# Patient Record
Sex: Female | Born: 1970 | Race: White | Hispanic: No | Marital: Married | Smoking: Never smoker
Health system: Southern US, Community
[De-identification: ages and names within clinical notes are randomized; demographics above are authoritative.]

## PROBLEM LIST (undated history)

## (undated) DIAGNOSIS — Z8759 Personal history of other complications of pregnancy, childbirth and the puerperium: Secondary | ICD-10-CM

## (undated) DIAGNOSIS — R3915 Urgency of urination: Secondary | ICD-10-CM

## (undated) DIAGNOSIS — R350 Frequency of micturition: Secondary | ICD-10-CM

## (undated) DIAGNOSIS — F988 Other specified behavioral and emotional disorders with onset usually occurring in childhood and adolescence: Secondary | ICD-10-CM

## (undated) DIAGNOSIS — D259 Leiomyoma of uterus, unspecified: Secondary | ICD-10-CM

## (undated) DIAGNOSIS — R319 Hematuria, unspecified: Secondary | ICD-10-CM

## (undated) DIAGNOSIS — N201 Calculus of ureter: Secondary | ICD-10-CM

## (undated) DIAGNOSIS — N200 Calculus of kidney: Secondary | ICD-10-CM

---

## 1998-04-23 ENCOUNTER — Other Ambulatory Visit: Admission: RE | Admit: 1998-04-23 | Discharge: 1998-04-23 | Payer: Self-pay | Admitting: Gynecology

## 1998-05-29 ENCOUNTER — Inpatient Hospital Stay (HOSPITAL_COMMUNITY): Admission: AD | Admit: 1998-05-29 | Discharge: 1998-05-29 | Payer: Self-pay | Admitting: Obstetrics and Gynecology

## 1998-06-01 ENCOUNTER — Ambulatory Visit (HOSPITAL_COMMUNITY): Admission: RE | Admit: 1998-06-01 | Discharge: 1998-06-01 | Payer: Self-pay | Admitting: Obstetrics and Gynecology

## 1998-06-05 ENCOUNTER — Inpatient Hospital Stay (HOSPITAL_COMMUNITY): Admission: AD | Admit: 1998-06-05 | Discharge: 1998-06-05 | Payer: Self-pay | Admitting: Gynecology

## 1998-06-08 ENCOUNTER — Ambulatory Visit (HOSPITAL_COMMUNITY): Admission: RE | Admit: 1998-06-08 | Discharge: 1998-06-08 | Payer: Self-pay | Admitting: Gynecology

## 1998-06-11 ENCOUNTER — Ambulatory Visit (HOSPITAL_COMMUNITY): Admission: RE | Admit: 1998-06-11 | Discharge: 1998-06-11 | Payer: Self-pay | Admitting: Gynecology

## 1998-06-20 ENCOUNTER — Encounter: Payer: Self-pay | Admitting: Gynecology

## 1998-06-20 ENCOUNTER — Observation Stay (HOSPITAL_COMMUNITY): Admission: AD | Admit: 1998-06-20 | Discharge: 1998-06-21 | Payer: Self-pay | Admitting: Gynecology

## 1998-06-24 ENCOUNTER — Ambulatory Visit (HOSPITAL_COMMUNITY): Admission: RE | Admit: 1998-06-24 | Discharge: 1998-06-24 | Payer: Self-pay | Admitting: Gynecology

## 1998-07-01 ENCOUNTER — Ambulatory Visit (HOSPITAL_COMMUNITY): Admission: RE | Admit: 1998-07-01 | Discharge: 1998-07-01 | Payer: Self-pay | Admitting: Gynecology

## 1998-08-24 ENCOUNTER — Encounter: Payer: Self-pay | Admitting: Gynecology

## 1998-08-24 ENCOUNTER — Inpatient Hospital Stay (HOSPITAL_COMMUNITY): Admission: RE | Admit: 1998-08-24 | Discharge: 1998-08-27 | Payer: Self-pay | Admitting: Gynecology

## 1998-11-25 ENCOUNTER — Encounter: Payer: Self-pay | Admitting: Gynecology

## 1998-11-25 ENCOUNTER — Ambulatory Visit (HOSPITAL_COMMUNITY): Admission: RE | Admit: 1998-11-25 | Discharge: 1998-11-25 | Payer: Self-pay | Admitting: Gynecology

## 1999-01-01 ENCOUNTER — Encounter: Payer: Self-pay | Admitting: Gynecology

## 1999-01-01 ENCOUNTER — Inpatient Hospital Stay (HOSPITAL_COMMUNITY): Admission: AD | Admit: 1999-01-01 | Discharge: 1999-01-04 | Payer: Self-pay | Admitting: Gynecology

## 1999-08-06 ENCOUNTER — Other Ambulatory Visit: Admission: RE | Admit: 1999-08-06 | Discharge: 1999-08-06 | Payer: Self-pay | Admitting: Obstetrics and Gynecology

## 1999-08-23 ENCOUNTER — Ambulatory Visit (HOSPITAL_COMMUNITY): Admission: RE | Admit: 1999-08-23 | Discharge: 1999-08-23 | Payer: Self-pay | Admitting: Obstetrics and Gynecology

## 2000-01-20 ENCOUNTER — Inpatient Hospital Stay (HOSPITAL_COMMUNITY): Admission: AD | Admit: 2000-01-20 | Discharge: 2000-01-20 | Payer: Self-pay | Admitting: Obstetrics and Gynecology

## 2000-05-11 ENCOUNTER — Inpatient Hospital Stay (HOSPITAL_COMMUNITY): Admission: AD | Admit: 2000-05-11 | Discharge: 2000-05-14 | Payer: Self-pay | Admitting: Obstetrics and Gynecology

## 2000-06-21 ENCOUNTER — Other Ambulatory Visit: Admission: RE | Admit: 2000-06-21 | Discharge: 2000-06-21 | Payer: Self-pay | Admitting: Obstetrics and Gynecology

## 2000-10-03 HISTORY — PX: MYOMECTOMY ABDOMINAL APPROACH: SUR870

## 2000-10-03 HISTORY — PX: ECTOPIC PREGNANCY SURGERY: SHX613

## 2001-06-29 ENCOUNTER — Inpatient Hospital Stay (HOSPITAL_COMMUNITY): Admission: AD | Admit: 2001-06-29 | Discharge: 2001-06-29 | Payer: Self-pay | Admitting: Obstetrics and Gynecology

## 2001-06-29 ENCOUNTER — Encounter: Payer: Self-pay | Admitting: Obstetrics and Gynecology

## 2001-07-17 ENCOUNTER — Inpatient Hospital Stay (HOSPITAL_COMMUNITY): Admission: AD | Admit: 2001-07-17 | Discharge: 2001-07-17 | Payer: Self-pay | Admitting: Obstetrics and Gynecology

## 2001-07-18 ENCOUNTER — Encounter: Payer: Self-pay | Admitting: Obstetrics and Gynecology

## 2001-08-10 ENCOUNTER — Encounter: Payer: Self-pay | Admitting: Obstetrics and Gynecology

## 2001-08-10 ENCOUNTER — Ambulatory Visit (HOSPITAL_COMMUNITY): Admission: RE | Admit: 2001-08-10 | Discharge: 2001-08-10 | Payer: Self-pay | Admitting: Obstetrics and Gynecology

## 2001-12-19 ENCOUNTER — Other Ambulatory Visit: Admission: RE | Admit: 2001-12-19 | Discharge: 2001-12-19 | Payer: Self-pay | Admitting: Obstetrics and Gynecology

## 2002-06-26 ENCOUNTER — Inpatient Hospital Stay (HOSPITAL_COMMUNITY): Admission: AD | Admit: 2002-06-26 | Discharge: 2002-06-29 | Payer: Self-pay | Admitting: Obstetrics and Gynecology

## 2003-03-25 ENCOUNTER — Encounter: Payer: Self-pay | Admitting: Urology

## 2003-03-25 ENCOUNTER — Ambulatory Visit (HOSPITAL_COMMUNITY): Admission: RE | Admit: 2003-03-25 | Discharge: 2003-03-25 | Payer: Self-pay | Admitting: Urology

## 2006-02-01 ENCOUNTER — Encounter: Payer: Self-pay | Admitting: Obstetrics and Gynecology

## 2011-12-16 ENCOUNTER — Other Ambulatory Visit (HOSPITAL_COMMUNITY): Payer: Self-pay | Admitting: Internal Medicine

## 2011-12-16 DIAGNOSIS — Z139 Encounter for screening, unspecified: Secondary | ICD-10-CM

## 2011-12-19 ENCOUNTER — Ambulatory Visit (HOSPITAL_COMMUNITY)
Admission: RE | Admit: 2011-12-19 | Discharge: 2011-12-19 | Disposition: A | Discharge status: Home / Self Care | Payer: BC Managed Care – PPO | Source: Ambulatory Visit | Attending: Internal Medicine | Admitting: Internal Medicine

## 2011-12-19 DIAGNOSIS — Z139 Encounter for screening, unspecified: Secondary | ICD-10-CM

## 2011-12-19 DIAGNOSIS — Z1231 Encounter for screening mammogram for malignant neoplasm of breast: Secondary | ICD-10-CM | POA: Insufficient documentation

## 2011-12-19 DIAGNOSIS — R928 Other abnormal and inconclusive findings on diagnostic imaging of breast: Secondary | ICD-10-CM | POA: Insufficient documentation

## 2011-12-26 ENCOUNTER — Other Ambulatory Visit: Payer: Self-pay | Admitting: Internal Medicine

## 2011-12-26 DIAGNOSIS — R928 Other abnormal and inconclusive findings on diagnostic imaging of breast: Secondary | ICD-10-CM

## 2011-12-28 ENCOUNTER — Other Ambulatory Visit (HOSPITAL_COMMUNITY): Payer: Self-pay | Admitting: Internal Medicine

## 2011-12-28 ENCOUNTER — Other Ambulatory Visit: Payer: Self-pay | Admitting: Internal Medicine

## 2011-12-28 ENCOUNTER — Ambulatory Visit (HOSPITAL_COMMUNITY)
Admission: RE | Admit: 2011-12-28 | Discharge: 2011-12-28 | Disposition: A | Discharge status: Home / Self Care | Payer: BC Managed Care – PPO | Source: Ambulatory Visit | Attending: Internal Medicine | Admitting: Internal Medicine

## 2011-12-28 DIAGNOSIS — R928 Other abnormal and inconclusive findings on diagnostic imaging of breast: Secondary | ICD-10-CM

## 2011-12-28 DIAGNOSIS — N6009 Solitary cyst of unspecified breast: Secondary | ICD-10-CM | POA: Insufficient documentation

## 2011-12-28 NOTE — Discharge Instructions (Signed)
Breast Biopsy WHY YOU NEED A BIOPSY Your caregiver has recommended that you have a breast tissue sample taken (biopsy). This is done to be certain that the lump or abnormality found in your breast is not cancerous (malignant). During a biopsy, a small piece of tissue is removed, so it can be examined under a microscope by a specialist (pathologist) who looks at tissues and cells and diagnoses abnormalities in them. Most lumps (tumors) or abnormalities, on or in the breast, are not cancerous (benign). However, biopsies are taken when your caregiver cannot be absolutely certain of what is wrong only from doing a physical exam, mammogram (breast X-ray), or other studies. A breast biopsy can tell you whether nothing more needs to be done, or you need more surgery or another type of treatment. A biopsy is done when there is:  Any undiagnosed breast mass.   Nipple abnormalities, dimpling, crusting, or ulcerations.   Calcium deposits (calcifications) or abnormalities seen on your mammogram, ultrasound, or MRI.   Suspicious changes in the breast (thickening, asymmetry) seen on mammogram.   Abnormal discharge from the nipple, especially blood.   Redness, swelling, and pain of the breast.  HOW A BIOPSY IS PERFORMED A biopsy is often performed on an outpatient basis (you go home the same day). This can be done in a hospital, clinic, or surgical center. Tissue samples (biopsies) are often done under local anesthesia (area is numbed). Sometimes general anesthetics are required, in which case you sleep through the procedure. Biopsies may remove the entire lump, a small piece of the lump, or a small sliver of tissue removed by needle. TYPES OF BREAST BIOPSY  Fine needle aspiration. A thin needle is placed through the skin, to the lump or cyst, and cells are removed.   Core needle biopsy. A large needle with a special tip is placed through the skin, to the abnormality, and a piece of tissue is removed.    Stereotactic biopsy. A core needle with a special X-ray is used, to direct the needle to the lump or abnormal area, which is difficult to feel or cannot be felt.   Vacuum-assisted biopsy. A hollow probe and a gentle vacuum remove a sample of tissue.   Ultrasound guided core needle biopsy. You lie on your stomach, with your breast through an opening, and a high frequency ultrasound helps guide the needle to the area of the abnormality.   Open biopsy. An incision is made in the breast, and a piece of the lump or the whole lump is removed.  LET YOUR CAREGIVER KNOW ABOUT:  Allergies.   Medicines taken, including herbs, eye drops, over-the-counter medicines, and creams.   Use of steroids (by mouth or creams).   Previous problems with anesthetics or Novocaine.   If you are taking aspirin or blood thinners.   Possibility of pregnancy, if this applies.   History of blood clots (thrombophlebitis).   History of bleeding or blood problems.   Previous surgery.   Other health problems.  RISKS AND COMPLICATIONS   Bleeding.   Infection.   Allergy to medicines.   Bruising and swelling of the breast.   Alteration in the shape of the breast.   Not finding the lump or abnormality.   Needing more surgery.  BEFORE THE PROCEDURE  You should arrive 60 minutes prior to your procedure or as directed.   Check-in at the admissions desk, to fill out necessary forms, if you are not preregistered.   There will be consent forms   to sign, prior to the procedure.   There is a waiting area for your family, while you are having your biopsy.   Try to have someone with you, to drive you home.   Do not smoke for 2 weeks before the surgery.   Let your caregiver know if you develop a cold or an infection.   Do not drink alcohol for at least 24 hours before surgery.   Wear a good support bra to the surgery.  AFTER THE PROCEDURE  After surgery, you will be taken to the recovery area, where a  nurse will watch and check your progress. Once you are awake, stable, and taking fluids well, if there are no other problems, you will be allowed to go home.   Ice packs applied to your operative site may help with discomfort and keep the swelling down.   You may resume normal diet and activities as directed. Avoid strenuous activities affecting the arm on the side of the biopsy, such as tennis, swimming, heavy lifting (more than 10 pounds) or pulling.   Bruising in the breast is normal following this procedure.   Wearing a support bra, even to bed, may be more comfortable. The bra will also help keep the dressing on.   Change dressings as directed.   Your doctor may apply a pressure dressing on your breast for 24 to 48 hours.   Only take over-the-counter or prescription medicines for pain, discomfort, or fever as directed by your caregiver.   Do not take aspirin, because it can cause bleeding.  HOME CARE INSTRUCTIONS   You may resume your usual diet.   Have someone drive you home after the surgery.   Do not do any exercise, driving, lifting or general activities without your caregiver's permission.   Take medicines and over-the-counter medicines, as ordered by your caregiver.   Keep your postoperative appointments as recommended.   Do not drink alcohol while taking pain medicine.  Finding out the results of your test Not all test results are available during your visit. If your test results are not back during the visit, make an appointment with your caregiver to find out the results. Do not assume everything is normal if you have not heard from your caregiver or the medical facility. It is important for you to follow up on all of your test results.  SEEK MEDICAL CARE IF:   You notice redness, swelling, or increasing pain in the wound.   You notice a bad smell coming from the wound or dressing.   You develop a rash.   You need stronger pain medicine.   You are having an  allergic reaction or problems with your medicines.  SEEK IMMEDIATE MEDICAL CARE IF:   You have difficulty breathing.   You have a fever.   There is increased bleeding (more than a small spot) from the wound.   Pus is coming from the wound.   The wound is breaking open.  Document Released: 09/19/2005 Document Revised: 09/08/2011 Document Reviewed: 08/07/2009 [facility] Patient Information 2012 [place], [place].Breast Biopsy  Care After Please read the instructions outlined below and refer to this sheet in the next few weeks. These discharge instructions provide you with general information on caring for yourself after you leave the hospital. Your caregiver may also give you specific instructions. While your treatment has been planned according to the most current medical practices available, unavoidable complications occasionally occur. If you have any problems or questions after discharge, please call your  caregiver. HOME CARE INSTRUCTIONS   Only take over-the-counter or prescription medicines for pain, discomfort, or fever as directed by your caregiver.   Do not take any product containing aspirin. It can cause bleeding.   Keep the stitches dry when bathing.   Avoid strenuous activities (stretching, reaching, jogging, lifting over 3 pounds) until your caregiver tells you it is okay.   Resume your usual diet.   Wear a good support bra, for as long as you are instructed.   If you are given an ice pack, apply it as many days as directed.   Change the dressing as advised.   Do not drink alcohol while taking pain medicine.   A small amount of bruising is normal after a biopsy.   Keep all your postoperative appointments.  Finding out the results of your test Ask when your test results will be ready. Make sure you get your test results. SEEK MEDICAL CARE IF:   You notice redness, swelling, or increasing pain in the wound.   You notice a bad smell coming from the wound or dressing.    The wound breaks open after the stitches (sutures), staples, or skin adhesive strips have been removed.   You develop a rash.   You have side effects or an allergic reaction to the medicine.   You need stronger medicine.  SEEK IMMEDIATE MEDICAL CARE IF:   You have a fever.   There is increased bleeding (more than a small spot) from the wound.   You have difficulty breathing.   Pus is coming from the wound.  Document Released: 04/08/2005 Document Revised: 09/08/2011 Document Reviewed: 08/07/2009 [facility] Patient Information 2012 [place], [place].

## 2011-12-28 NOTE — Progress Notes (Signed)
Lidocaine 2%       3mL injected 

## 2012-12-05 ENCOUNTER — Other Ambulatory Visit (HOSPITAL_COMMUNITY): Payer: Self-pay | Admitting: Family Medicine

## 2012-12-05 DIAGNOSIS — Z139 Encounter for screening, unspecified: Secondary | ICD-10-CM

## 2012-12-31 ENCOUNTER — Ambulatory Visit (HOSPITAL_COMMUNITY)
Admission: RE | Admit: 2012-12-31 | Discharge: 2012-12-31 | Disposition: A | Discharge status: Home / Self Care | Payer: BC Managed Care – PPO | Source: Ambulatory Visit | Attending: Family Medicine | Admitting: Family Medicine

## 2012-12-31 DIAGNOSIS — Z139 Encounter for screening, unspecified: Secondary | ICD-10-CM

## 2012-12-31 DIAGNOSIS — Z1231 Encounter for screening mammogram for malignant neoplasm of breast: Secondary | ICD-10-CM | POA: Insufficient documentation

## 2014-01-01 ENCOUNTER — Other Ambulatory Visit: Payer: Self-pay

## 2014-01-01 DIAGNOSIS — Z1231 Encounter for screening mammogram for malignant neoplasm of breast: Secondary | ICD-10-CM

## 2014-01-02 ENCOUNTER — Ambulatory Visit
Admission: RE | Admit: 2014-01-02 | Discharge: 2014-01-02 | Disposition: A | Discharge status: Home / Self Care | Payer: BC Managed Care – PPO | Source: Ambulatory Visit

## 2014-01-02 DIAGNOSIS — Z1231 Encounter for screening mammogram for malignant neoplasm of breast: Secondary | ICD-10-CM

## 2014-07-24 ENCOUNTER — Emergency Department (HOSPITAL_COMMUNITY): Payer: BC Managed Care – PPO

## 2014-07-24 ENCOUNTER — Encounter (HOSPITAL_COMMUNITY): Payer: Self-pay | Admitting: Emergency Medicine

## 2014-07-24 ENCOUNTER — Emergency Department (HOSPITAL_COMMUNITY)
Admission: EM | Admit: 2014-07-24 | Discharge: 2014-07-24 | Disposition: A | Discharge status: Home / Self Care | Payer: BC Managed Care – PPO | Attending: Emergency Medicine | Admitting: Emergency Medicine

## 2014-07-24 DIAGNOSIS — Z8659 Personal history of other mental and behavioral disorders: Secondary | ICD-10-CM | POA: Insufficient documentation

## 2014-07-24 DIAGNOSIS — R531 Weakness: Secondary | ICD-10-CM | POA: Diagnosis not present

## 2014-07-24 DIAGNOSIS — Y9389 Activity, other specified: Secondary | ICD-10-CM | POA: Diagnosis not present

## 2014-07-24 DIAGNOSIS — S161XXA Strain of muscle, fascia and tendon at neck level, initial encounter: Secondary | ICD-10-CM | POA: Diagnosis not present

## 2014-07-24 DIAGNOSIS — Y92410 Unspecified street and highway as the place of occurrence of the external cause: Secondary | ICD-10-CM | POA: Insufficient documentation

## 2014-07-24 DIAGNOSIS — S199XXA Unspecified injury of neck, initial encounter: Secondary | ICD-10-CM | POA: Diagnosis present

## 2014-07-24 HISTORY — DX: Other specified behavioral and emotional disorders with onset usually occurring in childhood and adolescence: F98.8

## 2014-07-24 MED ORDER — ONDANSETRON 4 MG PO TBDP
4.0000 mg | ORAL_TABLET | Freq: Once | ORAL | Status: AC
  Administered 2014-07-24: 4 mg via ORAL
  Filled 2014-07-24: qty 1

## 2014-07-24 MED ORDER — ONDANSETRON 4 MG PO TBDP: ORAL_TABLET | ORAL | Status: DC

## 2014-07-24 MED ORDER — NAPROXEN 500 MG PO TABS: 500.0000 mg | ORAL_TABLET | Freq: Two times a day (BID) | ORAL | Status: DC
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
## 2014-07-24 NOTE — Discharge Instructions (Signed)
Follow up with your family md next week. °
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
## 2014-07-24 NOTE — ED Notes (Signed)
Discharge instructions and prescriptions given and reviewed with patient.  Patient verbalized understanding to follow up with PMD next week and to take medication as directed.  Patient ambulatory with steady gait; discharged home in good condition.

## 2014-07-31 ENCOUNTER — Other Ambulatory Visit (HOSPITAL_COMMUNITY): Payer: Self-pay | Admitting: Family Medicine

## 2014-07-31 DIAGNOSIS — R51 Headache: Principal | ICD-10-CM

## 2014-07-31 DIAGNOSIS — R519 Headache, unspecified: Secondary | ICD-10-CM

## 2014-07-31 DIAGNOSIS — R410 Disorientation, unspecified: Secondary | ICD-10-CM

## 2014-08-01 ENCOUNTER — Ambulatory Visit (HOSPITAL_COMMUNITY)
Admission: RE | Admit: 2014-08-01 | Discharge: 2014-08-01 | Disposition: A | Discharge status: Home / Self Care | Payer: BC Managed Care – PPO | Source: Ambulatory Visit | Attending: Family Medicine | Admitting: Family Medicine

## 2014-08-01 DIAGNOSIS — R410 Disorientation, unspecified: Secondary | ICD-10-CM

## 2014-08-01 DIAGNOSIS — R519 Headache, unspecified: Secondary | ICD-10-CM

## 2014-08-01 DIAGNOSIS — R51 Headache: Secondary | ICD-10-CM | POA: Insufficient documentation

## 2014-08-09 ENCOUNTER — Encounter (HOSPITAL_COMMUNITY): Payer: Self-pay | Admitting: Emergency Medicine

## 2014-08-09 ENCOUNTER — Emergency Department (HOSPITAL_COMMUNITY): Payer: BC Managed Care – PPO

## 2014-08-09 ENCOUNTER — Emergency Department (HOSPITAL_COMMUNITY)
Admission: EM | Admit: 2014-08-09 | Discharge: 2014-08-09 | Disposition: A | Discharge status: Home / Self Care | Payer: BC Managed Care – PPO | Attending: Emergency Medicine | Admitting: Emergency Medicine

## 2014-08-09 DIAGNOSIS — R200 Anesthesia of skin: Secondary | ICD-10-CM | POA: Insufficient documentation

## 2014-08-09 DIAGNOSIS — Z79899 Other long term (current) drug therapy: Secondary | ICD-10-CM | POA: Insufficient documentation

## 2014-08-09 DIAGNOSIS — F909 Attention-deficit hyperactivity disorder, unspecified type: Secondary | ICD-10-CM | POA: Insufficient documentation

## 2014-08-09 DIAGNOSIS — Z3202 Encounter for pregnancy test, result negative: Secondary | ICD-10-CM | POA: Diagnosis not present

## 2014-08-09 DIAGNOSIS — R4701 Aphasia: Secondary | ICD-10-CM | POA: Diagnosis present

## 2014-08-09 DIAGNOSIS — R479 Unspecified speech disturbances: Secondary | ICD-10-CM

## 2014-08-09 DIAGNOSIS — F8081 Childhood onset fluency disorder: Secondary | ICD-10-CM

## 2014-08-09 LAB — CBC WITH DIFFERENTIAL/PLATELET
Basophils Absolute: 0.1 10*3/uL (ref 0.0–0.1)
Basophils Relative: 1 % (ref 0–1)
Eosinophils Absolute: 0.1 10*3/uL (ref 0.0–0.7)
Eosinophils Relative: 1 % (ref 0–5)
HCT: 42.5 % (ref 36.0–46.0)
HEMOGLOBIN: 13.8 g/dL (ref 12.0–15.0)
LYMPHS ABS: 1.3 10*3/uL (ref 0.7–4.0)
Lymphocytes Relative: 17 % (ref 12–46)
MCH: 29.6 pg (ref 26.0–34.0)
MCHC: 32.5 g/dL (ref 30.0–36.0)
MCV: 91.2 fL (ref 78.0–100.0)
MONOS PCT: 8 % (ref 3–12)
Monocytes Absolute: 0.6 10*3/uL (ref 0.1–1.0)
NEUTROS ABS: 5.5 10*3/uL (ref 1.7–7.7)
NEUTROS PCT: 73 % (ref 43–77)
Platelets: 295 10*3/uL (ref 150–400)
RBC: 4.66 MIL/uL (ref 3.87–5.11)
RDW: 13 % (ref 11.5–15.5)
WBC: 7.4 10*3/uL (ref 4.0–10.5)

## 2014-08-09 LAB — BASIC METABOLIC PANEL
Anion gap: 14 (ref 5–15)
BUN: 10 mg/dL (ref 6–23)
CHLORIDE: 102 meq/L (ref 96–112)
CO2: 25 meq/L (ref 19–32)
Calcium: 9.5 mg/dL (ref 8.4–10.5)
Creatinine, Ser: 0.48 mg/dL — ABNORMAL LOW (ref 0.50–1.10)
GFR calc Af Amer: >90 mL/min (ref >90)
GFR calc non Af Amer: >90 mL/min (ref >90)
GLUCOSE: 113 mg/dL — AB (ref 70–99)
POTASSIUM: 3.4 meq/L — AB (ref 3.7–5.3)
Sodium: 141 mEq/L (ref 137–147)

## 2014-08-09 LAB — URINALYSIS, ROUTINE W REFLEX MICROSCOPIC
Bilirubin Urine: NEGATIVE
GLUCOSE, UA: 250 mg/dL — AB
Hgb urine dipstick: NEGATIVE
Ketones, ur: NEGATIVE mg/dL
LEUKOCYTES UA: NEGATIVE
Nitrite: NEGATIVE
PH: 7 (ref 5.0–8.0)
PROTEIN: NEGATIVE mg/dL
Specific Gravity, Urine: 1.018 (ref 1.005–1.030)
Urobilinogen, UA: 0.2 mg/dL (ref 0.0–1.0)

## 2014-08-09 LAB — POC URINE PREG, ED: Preg Test, Ur: NEGATIVE

## 2014-08-09 MED ORDER — SODIUM CHLORIDE 0.9 % IV SOLN
INTRAVENOUS | Status: DC
  Administered 2014-08-09: 12:00:00 via INTRAVENOUS

## 2014-08-09 MED ORDER — GADOBENATE DIMEGLUMINE 529 MG/ML IV SOLN
10.0000 mL | Freq: Once | INTRAVENOUS | Status: AC | PRN
  Administered 2014-08-09: 10 mL via INTRAVENOUS

## 2014-08-09 MED ORDER — LORAZEPAM 2 MG/ML IJ SOLN
1.0000 mg | Freq: Once | INTRAMUSCULAR | Status: AC
  Administered 2014-08-09: 1 mg via INTRAVENOUS
  Filled 2014-08-09: qty 1

## 2014-08-09 NOTE — Discharge Instructions (Signed)
You had a normal MRI and MRA of your brain, MRA of your neck.  Her neurologist has seen you and does not feel that your symptoms are due to a stroke or postconcussive symptoms. Your labs, urine were normal today. We feel you're safe to go home. Our neurologist feels that your symptoms should begin to improve over time. Please follow-up with your PCP for further evaluation.    Concussion A concussion, or closed-head injury, is a brain injury caused by a direct blow to the head or by a quick and sudden movement (jolt) of the head or neck. Concussions are usually not life-threatening. Even so, the effects of a concussion can be serious. If you have had a concussion before, you are more likely to experience concussion-like symptoms after a direct blow to the head.  CAUSES  Direct blow to the head, such as from running into another player during a soccer game, being hit in a fight, or hitting your head on a hard surface.  A jolt of the head or neck that causes the brain to move back and forth inside the skull, such as in a car crash. SIGNS AND SYMPTOMS The signs of a concussion can be hard to notice. Early on, they may be missed by you, family members, and health care providers. You may look fine but act or feel differently. Symptoms are usually temporary, but they may last for days, weeks, or even longer. Some symptoms may appear right away while others may not show up for hours or days. Every head injury is different. Symptoms include:  Mild to moderate headaches that will not go away.  A feeling of pressure inside your head.  Having more trouble than usual:  Learning or remembering things you have heard.  Answering questions.  Paying attention or concentrating.  Organizing daily tasks.  Making decisions and solving problems.  Slowness in thinking, acting or reacting, speaking, or reading.  Getting lost or being easily confused.  Feeling tired all the time or lacking energy  (fatigued).  Feeling drowsy.  Sleep disturbances.  Sleeping more than usual.  Sleeping less than usual.  Trouble falling asleep.  Trouble sleeping (insomnia).  Loss of balance or feeling lightheaded or dizzy.  Nausea or vomiting.  Numbness or tingling.  Increased sensitivity to:  Sounds.  Lights.  Distractions.  Vision problems or eyes that tire easily.  Diminished sense of taste or smell.  Ringing in the ears.  Mood changes such as feeling sad or anxious.  Becoming easily irritated or angry for little or no reason.  Lack of motivation.  Seeing or hearing things other people do not see or hear (hallucinations). DIAGNOSIS Your health care provider can usually diagnose a concussion based on a description of your injury and symptoms. He or she will ask whether you passed out (lost consciousness) and whether you are having trouble remembering events that happened right before and during your injury. Your evaluation might include:  A brain scan to look for signs of injury to the brain. Even if the test shows no injury, you may still have a concussion.  Blood tests to be sure other problems are not present. TREATMENT  Concussions are usually treated in an emergency department, in urgent care, or at a clinic. You may need to stay in the hospital overnight for further treatment.  Tell your health care provider if you are taking any medicines, including prescription medicines, over-the-counter medicines, and natural remedies. Some medicines, such as blood thinners (anticoagulants) and  aspirin, may increase the chance of complications. Also tell your health care provider whether you have had alcohol or are taking illegal drugs. This information may affect treatment.  Your health care provider will send you home with important instructions to follow.  How fast you will recover from a concussion depends on many factors. These factors include how severe your concussion is,  what part of your brain was injured, your age, and how healthy you were before the concussion.  Most people with mild injuries recover fully. Recovery can take time. In general, recovery is slower in older persons. Also, persons who have had a concussion in the past or have other medical problems may find that it takes longer to recover from their current injury. HOME CARE INSTRUCTIONS General Instructions  Carefully follow the directions your health care provider gave you.  Only take over-the-counter or prescription medicines for pain, discomfort, or fever as directed by your health care provider.  Take only those medicines that your health care provider has approved.  Do not drink alcohol until your health care provider says you are well enough to do so. Alcohol and certain other drugs may slow your recovery and can put you at risk of further injury.  If it is harder than usual to remember things, write them down.  If you are easily distracted, try to do one thing at a time. For example, do not try to watch TV while fixing dinner.  Talk with family members or close friends when making important decisions.  Keep all follow-up appointments. Repeated evaluation of your symptoms is recommended for your recovery.  Watch your symptoms and tell others to do the same. Complications sometimes occur after a concussion. Older adults with a brain injury may have a higher risk of serious complications, such as a blood clot on the brain.  Tell your teachers, school nurse, school counselor, coach, athletic trainer, or work Freight forwarder about your injury, symptoms, and restrictions. Tell them about what you can or cannot do. They should watch for:  Increased problems with attention or concentration.  Increased difficulty remembering or learning new information.  Increased time needed to complete tasks or assignments.  Increased irritability or decreased ability to cope with stress.  Increased  symptoms.  Rest. Rest helps the brain to heal. Make sure you:  Get plenty of sleep at night. Avoid staying up late at night.  Keep the same bedtime hours on weekends and weekdays.  Rest during the day. Take daytime naps or rest breaks when you feel tired.  Limit activities that require a lot of thought or concentration. These include:  Doing homework or job-related work.  Watching TV.  Working on the computer.  Avoid any situation where there is potential for another head injury (football, hockey, soccer, basketball, martial arts, downhill snow sports and horseback riding). Your condition will get worse every time you experience a concussion. You should avoid these activities until you are evaluated by the appropriate follow-up health care providers. Returning To Your Regular Activities You will need to return to your normal activities slowly, not all at once. You must give your body and brain enough time for recovery.  Do not return to sports or other athletic activities until your health care provider tells you it is safe to do so.  Ask your health care provider when you can drive, ride a bicycle, or operate heavy machinery. Your ability to react may be slower after a brain injury. Never do these activities if you  are dizzy.  Ask your health care provider about when you can return to work or school. Preventing Another Concussion It is very important to avoid another brain injury, especially before you have recovered. In rare cases, another injury can lead to permanent brain damage, brain swelling, or death. The risk of this is greatest during the first 7-10 days after a head injury. Avoid injuries by:  Wearing a seat belt when riding in a car.  Drinking alcohol only in moderation.  Wearing a helmet when biking, skiing, skateboarding, skating, or doing similar activities.  Avoiding activities that could lead to a second concussion, such as contact or recreational sports, until  your health care provider says it is okay.  Taking safety measures in your home.  Remove clutter and tripping hazards from floors and stairways.  Use grab bars in bathrooms and handrails by stairs.  Place non-slip mats on floors and in bathtubs.  Improve lighting in dim areas. SEEK MEDICAL CARE IF:  You have increased problems paying attention or concentrating.  You have increased difficulty remembering or learning new information.  You need more time to complete tasks or assignments than before.  You have increased irritability or decreased ability to cope with stress.  You have more symptoms than before. Seek medical care if you have any of the following symptoms for more than 2 weeks after your injury:  Lasting (chronic) headaches.  Dizziness or balance problems.  Nausea.  Vision problems.  Increased sensitivity to noise or light.  Depression or mood swings.  Anxiety or irritability.  Memory problems.  Difficulty concentrating or paying attention.  Sleep problems.  Feeling tired all the time. SEEK IMMEDIATE MEDICAL CARE IF:  You have severe or worsening headaches. These may be a sign of a blood clot in the brain.  You have weakness (even if only in one hand, leg, or part of the face).  You have numbness.  You have decreased coordination.  You vomit repeatedly.  You have increased sleepiness.  One pupil is larger than the other.  You have convulsions.  You have slurred speech.  You have increased confusion. This may be a sign of a blood clot in the brain.  You have increased restlessness, agitation, or irritability.  You are unable to recognize people or places.  You have neck pain.  It is difficult to wake you up.  You have unusual behavior changes.  You lose consciousness. MAKE SURE YOU:  Understand these instructions.  Will watch your condition.  Will get help right away if you are not doing well or get worse. Document  Released: 12/10/2003 Document Revised: 09/24/2013 Document Reviewed: 04/11/2013 [facility] Patient Information 2015 [place], [place]. This information is not intended to replace advice given to you by your health care provider. Make sure you discuss any questions you have with your health care provider.
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
## 2014-08-09 NOTE — ED Notes (Addendum)
Pt denies numbness and tingling except in left pinky finger. EKG done. Pt hooked to monitor. Pt alert/oriented x4, able to follow commands, able to walk, able to write, unable to speak clearly. Pt denies hearing loss, pt does report ringing in ears. MD at bedside.

## 2014-08-09 NOTE — ED Notes (Signed)
Pt gone to MRI, will get urine as soon as pt returns.

## 2014-08-09 NOTE — ED Notes (Signed)
Pt returned from MRI °
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
## 2014-08-09 NOTE — ED Notes (Signed)
Neurologist at bedside. 

## 2014-08-09 NOTE — ED Notes (Signed)
Patient remains in MRI; family has no needs.
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
## 2016-10-08 ENCOUNTER — Emergency Department (HOSPITAL_COMMUNITY): Payer: BC Managed Care – PPO

## 2016-10-08 ENCOUNTER — Encounter (HOSPITAL_COMMUNITY): Payer: Self-pay

## 2016-10-08 ENCOUNTER — Emergency Department (HOSPITAL_COMMUNITY)
Admission: EM | Admit: 2016-10-08 | Discharge: 2016-10-08 | Disposition: A | Discharge status: Home / Self Care | Payer: BC Managed Care – PPO | Attending: Emergency Medicine | Admitting: Emergency Medicine

## 2016-10-08 DIAGNOSIS — Z79899 Other long term (current) drug therapy: Secondary | ICD-10-CM | POA: Diagnosis not present

## 2016-10-08 DIAGNOSIS — N23 Unspecified renal colic: Secondary | ICD-10-CM | POA: Diagnosis not present

## 2016-10-08 DIAGNOSIS — R109 Unspecified abdominal pain: Secondary | ICD-10-CM

## 2016-10-08 LAB — CBC WITH DIFFERENTIAL/PLATELET
BASOS ABS: 0.1 10*3/uL (ref 0.0–0.1)
Basophils Relative: 1 %
EOS PCT: 2 %
Eosinophils Absolute: 0.1 10*3/uL (ref 0.0–0.7)
HEMATOCRIT: 38.6 % (ref 36.0–46.0)
Hemoglobin: 13 g/dL (ref 12.0–15.0)
LYMPHS PCT: 33 %
Lymphs Abs: 2.4 10*3/uL (ref 0.7–4.0)
MCH: 31 pg (ref 26.0–34.0)
MCHC: 33.7 g/dL (ref 30.0–36.0)
MCV: 92.1 fL (ref 78.0–100.0)
Monocytes Absolute: 0.8 10*3/uL (ref 0.1–1.0)
Monocytes Relative: 11 %
NEUTROS ABS: 3.9 10*3/uL (ref 1.7–7.7)
Neutrophils Relative %: 53 %
Platelets: 304 10*3/uL (ref 150–400)
RBC: 4.19 MIL/uL (ref 3.87–5.11)
RDW: 12.6 % (ref 11.5–15.5)
WBC: 7.2 10*3/uL (ref 4.0–10.5)

## 2016-10-08 LAB — URINALYSIS, ROUTINE W REFLEX MICROSCOPIC
BACTERIA UA: NONE SEEN
BILIRUBIN URINE: NEGATIVE
Glucose, UA: 50 mg/dL — AB
Ketones, ur: NEGATIVE mg/dL
NITRITE: NEGATIVE
PH: 7 (ref 5.0–8.0)
Protein, ur: NEGATIVE mg/dL
SPECIFIC GRAVITY, URINE: 1.015 (ref 1.005–1.030)

## 2016-10-08 LAB — COMPREHENSIVE METABOLIC PANEL
ALK PHOS: 59 U/L (ref 38–126)
ALT: 24 U/L (ref 14–54)
AST: 25 U/L (ref 15–41)
Albumin: 4.4 g/dL (ref 3.5–5.0)
Anion gap: 9 (ref 5–15)
BUN: 14 mg/dL (ref 6–20)
CO2: 26 mmol/L (ref 22–32)
Calcium: 9.6 mg/dL (ref 8.9–10.3)
Chloride: 104 mmol/L (ref 101–111)
Creatinine, Ser: 0.61 mg/dL (ref 0.44–1.00)
Glucose, Bld: 158 mg/dL — ABNORMAL HIGH (ref 65–99)
Potassium: 3.2 mmol/L — ABNORMAL LOW (ref 3.5–5.1)
Sodium: 139 mmol/L (ref 135–145)
TOTAL PROTEIN: 7.2 g/dL (ref 6.5–8.1)
Total Bilirubin: 0.6 mg/dL (ref 0.3–1.2)

## 2016-10-08 LAB — I-STAT BETA HCG BLOOD, ED (MC, WL, AP ONLY): I-stat hCG, quantitative: <5 m[IU]/mL (ref <5)

## 2016-10-08 LAB — HCG, QUANTITATIVE, PREGNANCY: HCG, BETA CHAIN, QUANT, S: 4 m[IU]/mL (ref <5)

## 2016-10-08 MED ORDER — IBUPROFEN 800 MG PO TABS: 800.0000 mg | ORAL_TABLET | Freq: Three times a day (TID) | ORAL | 0 refills | Status: DC

## 2016-10-08 MED ORDER — ONDANSETRON HCL 4 MG/2ML IJ SOLN
4.0000 mg | Freq: Once | INTRAMUSCULAR | Status: AC
  Administered 2016-10-08: 4 mg via INTRAVENOUS
  Filled 2016-10-08: qty 2

## 2016-10-08 MED ORDER — KETOROLAC TROMETHAMINE 30 MG/ML IJ SOLN
30.0000 mg | Freq: Once | INTRAMUSCULAR | Status: AC
  Administered 2016-10-08: 30 mg via INTRAVENOUS
  Filled 2016-10-08: qty 1

## 2016-10-08 MED ORDER — ONDANSETRON HCL 4 MG PO TABS: 4.0000 mg | ORAL_TABLET | Freq: Four times a day (QID) | ORAL | 0 refills | Status: AC

## 2016-10-08 MED ORDER — SODIUM CHLORIDE 0.9 % IV BOLUS (SEPSIS)
1000.0000 mL | Freq: Once | INTRAVENOUS | Status: AC
  Administered 2016-10-08: 1000 mL via INTRAVENOUS

## 2016-10-08 MED ORDER — HYDROMORPHONE HCL 1 MG/ML IJ SOLN
1.0000 mg | Freq: Once | INTRAMUSCULAR | Status: AC
  Administered 2016-10-08: 1 mg via INTRAVENOUS
  Filled 2016-10-08: qty 1

## 2016-10-08 MED ORDER — OXYCODONE-ACETAMINOPHEN 5-325 MG PO TABS: 1.0000 | ORAL_TABLET | ORAL | 0 refills | Status: DC | PRN

## 2016-10-08 MED ORDER — CIPROFLOXACIN HCL 500 MG PO TABS: 500.0000 mg | ORAL_TABLET | Freq: Two times a day (BID) | ORAL | 0 refills | Status: DC

## 2016-10-08 NOTE — Discharge Instructions (Signed)
Take the pain medication and antibiotics as prescribed. Followup with the urologist. Return to the ED if you develop new or worsening symptoms.

## 2016-10-08 NOTE — ED Notes (Signed)
Patient transported to CT 

## 2016-10-08 NOTE — ED Provider Notes (Signed)
[facility] DEPT Provider Note   CSN: [id number] Arrival date & time: 10/08/16  0000  By signing my name below, I, [name], attest that this documentation has been prepared under the direction and in the presence of Marie Essex, MD. Electronically Signed: Judithe Briggs, ER Scribe. 05/14/2016. 12:32 AM.  History   Chief Complaint Chief Complaint  Patient presents with  . Flank Pain   The history is provided by the patient and a relative. No language interpreter was used.    HPI Comments: [name] is a 46 y.o. female who presents to the Emergency Department complaining of severe non-radiating left sided flank pain for the past half hour. Her LNMP was 10-11 months ago. She is not on birth control. She has no past surgical hx of cholycystectomy or appendectomy. She has a PMHx of two tubal pregnancies and one ectopic pregnancy with rupture which required surgery. She denies nausea, vomiting. She has been moving her bowels normally. She has NKDA. She has been on low dose abx for multiple years due to frequent infections. Her PCP is Dr. Hilma Favors. Her last tubal pregnancy was in 2002.    Past Medical History:  Diagnosis Date  . ADD (attention deficit disorder)     Patient Active Problem List   Diagnosis Date Noted  . Speech abnormality 08/09/2014  . Stuttering     Past Surgical History:  Procedure Laterality Date  . CESAREAN SECTION    . ECTOPIC PREGNANCY SURGERY      OB History    No data available       Home Medications    Prior to Admission medications   Medication Sig Start Date End Date Taking? Authorizing Provider  methylphenidate 36 MG PO CR tablet Take 72 mg by mouth daily.   Yes Historical Provider, MD  ibuprofen (ADVIL,MOTRIN) 200 MG tablet Take 600-800 mg by mouth every 6 (six) hours as needed for headache or moderate pain.    Historical Provider, MD  naproxen (NAPROSYN) 500 MG tablet Take 1 tablet (500 mg total) by mouth 2 (two) times  daily. 07/24/14   Marie Ferguson, MD  ondansetron (ZOFRAN ODT) 4 MG disintegrating tablet 4mg  ODT q4 hours prn nausea/vomit 07/24/14   Marie Ferguson, MD    Family History No family history on file.  Social History Social History  Substance Use Topics  . Smoking status: Never Smoker  . Smokeless tobacco: Never Used  . Alcohol use No     Allergies   Patient has no known allergies.   Review of Systems Review of Systems  Constitutional: Negative for chills and fever.  Gastrointestinal: Positive for abdominal pain. Negative for nausea and vomiting.  Genitourinary: Positive for flank pain.   A complete 10 system review of systems was obtained and all systems are negative except as noted in the HPI and PMH.    Physical Exam Updated Vital Signs BP 132/83 (BP Location: Left Arm)   Pulse 72   Temp 97.9 F (36.6 C) (Oral)   Resp 24   Ht 5\' 4"  (1.626 m)   Wt 140 lb (63.5 kg)   LMP 10/09/2015   SpO2 100%   BMI 24.03 kg/m   Physical Exam  Constitutional: She is oriented to person, place, and time. She appears well-developed and well-nourished. No distress.  Pt is clearly uncomfortable   HENT:  Head: Normocephalic and atraumatic.  Mouth/Throat: Oropharynx is clear and moist. No oropharyngeal exudate.  Eyes: Conjunctivae and EOM are normal. Pupils are  equal, round, and reactive to light.  Neck: Normal range of motion. Neck supple.  No meningismus.  Cardiovascular: Normal rate, regular rhythm, normal heart sounds and intact distal pulses.   No murmur heard. Pulmonary/Chest: Effort normal and breath sounds normal. No respiratory distress.  Abdominal: Soft. There is no tenderness. There is no rebound and no guarding.  LLQ TTP and left CVA TTP.   Musculoskeletal: Normal range of motion. She exhibits no edema or tenderness.  Neurological: She is alert and oriented to person, place, and time. No cranial nerve deficit. She exhibits normal muscle tone. Coordination normal.  No  ataxia on finger to nose bilaterally. No pronator drift. 5/5 strength throughout. CN 2-12 intact.Equal grip strength. Sensation intact.   Skin: Skin is warm.  Psychiatric: She has a normal mood and affect. Her behavior is normal.  Nursing note and vitals reviewed.    ED Treatments / Results  DIAGNOSTIC STUDIES: Oxygen Saturation is 100% on RA, normal by my interpretation.    COORDINATION OF CARE: 12:30 AM Discussed treatment plan with pt at bedside and pt agreed to plan.  Labs (all labs ordered are listed, but only abnormal results are displayed) Labs Reviewed  COMPREHENSIVE METABOLIC PANEL - Abnormal; Notable for the following:       Result Value   Potassium 3.2 (*)    Glucose, Bld 158 (*)    All other components within normal limits  HCG, QUANTITATIVE, PREGNANCY  CBC WITH DIFFERENTIAL/PLATELET  URINALYSIS, ROUTINE W REFLEX MICROSCOPIC  I-STAT BETA HCG BLOOD, ED ([facility], [facility], [facility] ONLY)    EKG  EKG Interpretation None       Radiology Ct Renal Stone Study  Result Date: 10/08/2016 CLINICAL DATA:  Sudden onset of left flank pain 30 minutes prior to arrival. EXAM: CT ABDOMEN AND PELVIS WITHOUT CONTRAST TECHNIQUE: Multidetector CT imaging of the abdomen and pelvis was performed following the standard protocol without IV contrast. COMPARISON:  None. FINDINGS: Lower chest: No acute abnormality. Hepatobiliary: No focal liver abnormality is seen. No gallstones, gallbladder wall thickening, or biliary dilatation. Pancreas: Unremarkable. No pancreatic ductal dilatation or surrounding inflammatory changes. Spleen: Normal in size without focal abnormality. Adrenals/Urinary Tract: Both adrenals are normal. Marked hydronephrosis and ureteral dilatation on the left due to an obstructing 5 mm calculus 1 cm from the ureterovesical junction. 2 mm nonobstructing calculi in both upper pole renal collecting systems. Urinary bladder is unremarkable. Stomach/Bowel: Stomach, small bowel and colon are  unremarkable. Vascular/Lymphatic: The abdominal aorta is normal in caliber without atherosclerotic calcification. No adenopathy or ascites. Reproductive: Contour irregularities of the uterus likely represent multiple fibroids measuring up to 2.5 cm. No adnexal masses. Other: Incidental small fat containing umbilical hernia. Musculoskeletal: No significant skeletal lesion. IMPRESSION: 1. Obstructing 5 mm calculus of the distal left ureter, 1 cm from the UVJ. Marked hydronephrosis. 2. Bilateral upper pole nephrolithiasis 3. Multiple uterine fibroids. Electronically Signed   By: Andreas Newport M.D.   On: 10/08/2016 02:06    Procedures Procedures (including critical care time)  Medications Ordered in ED Medications  sodium chloride 0.9 % bolus 1,000 mL (1,000 mLs Intravenous New Bag/Given 10/08/16 0046)  ondansetron (ZOFRAN) injection 4 mg (4 mg Intravenous Given 10/08/16 0045)  HYDROmorphone (DILAUDID) injection 1 mg (1 mg Intravenous Given 10/08/16 0043)     Initial Impression / Assessment and Plan / ED Course  I have reviewed the triage vital signs and the nursing notes.  Pertinent labs & imaging results that were available during my care of the  patient were reviewed by me and considered in my medical decision making (see chart for details).  Clinical Course    Acute onset of severe left-sided flank pain with nausea and vomiting. History of ectopic pregnancy. Last menstrual period 11 months ago. Concern for kidney stone.  Patient is not pregnant. CT shows 5 mm stone with hydronephrosis.  Pain improved on recheck. UA with WBCs and blood.  Culture sent.   Patient well appearing.  D/w Dr. Pilar Jarvis who agrees with adding cipro to her prophylactic macrobid and will see in office.    Patient has improved. She is tolerating by mouth. She is smiling and comfortable. Discussed follow-up with urology. Return to the ED with worsening symptoms including fever, pain, vomiting or any other  concerns.  Final Clinical Impressions(s) / ED Diagnoses   Final diagnoses:  Flank pain  Ureteral colic    New Prescriptions New Prescriptions   No medications on file    I personally performed the services described in this documentation, which was scribed in my presence. The recorded information has been reviewed and is accurate.       Marie Essex, MD 10/08/16 [phone]

## 2016-10-08 NOTE — ED Notes (Signed)
Pt alert & oriented x4, stable gait. Patient given discharge instructions, paperwork & prescription(s). Patient  instructed to stop at the registration desk to finish any additional paperwork. Patient verbalized understanding. Pt left department w/ no further questions. 

## 2016-10-08 NOTE — ED Triage Notes (Signed)
Sudden onset of left flank pain 30 minutes pta, states pain is similar to tubal pregnancy withh rupture in the past

## 2016-10-08 NOTE — ED Notes (Signed)
Called lab to check on urine. In process now.

## 2016-10-10 LAB — URINE CULTURE: Culture: NO GROWTH

## 2016-10-13 ENCOUNTER — Other Ambulatory Visit: Payer: Self-pay | Admitting: Urology

## 2016-10-14 ENCOUNTER — Encounter (HOSPITAL_BASED_OUTPATIENT_CLINIC_OR_DEPARTMENT_OTHER): Payer: Self-pay | Admitting: *Deleted

## 2016-10-14 NOTE — Progress Notes (Signed)
NPO AFTER MN.  ARRIVE AT 0830. CURRENT LAB RESULTS IN CHART.  MAY TAKE PAIN/ NAUSEA RX IF NEEDED AM DOS W/ SIPS OF WATER.

## 2016-10-19 ENCOUNTER — Ambulatory Visit (HOSPITAL_BASED_OUTPATIENT_CLINIC_OR_DEPARTMENT_OTHER): Admission: RE | Admit: 2016-10-19 | Payer: BC Managed Care – PPO | Source: Ambulatory Visit | Admitting: Urology

## 2016-10-19 HISTORY — DX: Urgency of urination: R39.15

## 2016-10-19 HISTORY — DX: Calculus of kidney: N20.0

## 2016-10-19 HISTORY — DX: Hematuria, unspecified: R31.9

## 2016-10-19 HISTORY — DX: Personal history of other complications of pregnancy, childbirth and the puerperium: Z87.59

## 2016-10-19 HISTORY — DX: Calculus of ureter: N20.1

## 2016-10-19 HISTORY — DX: Leiomyoma of uterus, unspecified: D25.9

## 2016-10-19 HISTORY — DX: Frequency of micturition: R35.0

## 2016-10-19 SURGERY — CYSTOURETEROSCOPY, WITH RETROGRADE PYELOGRAM AND STENT INSERTION
Anesthesia: General | Laterality: Left

## 2016-10-19 NOTE — Discharge Instructions (Signed)
1 - You may have urinary urgency (bladder spasms) and bloody urine on / off with stent in place. This is normal. ° °2 - Call MD or go to ER for fever >102, severe pain / nausea / vomiting not relieved by medications, or acute change in medical status ° °

## 2016-10-21 ENCOUNTER — Other Ambulatory Visit: Payer: Self-pay | Admitting: Urology

## 2016-11-01 ENCOUNTER — Encounter (HOSPITAL_BASED_OUTPATIENT_CLINIC_OR_DEPARTMENT_OTHER): Payer: Self-pay | Admitting: *Deleted

## 2016-11-01 NOTE — Progress Notes (Signed)
NPO AFTER MN WITH EXCEPTION CLEAR LIQUIDS UNTIL 0730 (NO CREAM/MILK PRODUCTS).  ARRIVE AT 1215.  CURRENT LAB RESULTS IN CHART AND EPIC.

## 2016-11-03 ENCOUNTER — Ambulatory Visit (HOSPITAL_BASED_OUTPATIENT_CLINIC_OR_DEPARTMENT_OTHER): Payer: BC Managed Care – PPO | Admitting: Anesthesiology

## 2016-11-03 ENCOUNTER — Ambulatory Visit (HOSPITAL_BASED_OUTPATIENT_CLINIC_OR_DEPARTMENT_OTHER)
Admission: RE | Admit: 2016-11-03 | Discharge: 2016-11-03 | Disposition: A | Discharge status: Home / Self Care | Payer: BC Managed Care – PPO | Source: Ambulatory Visit | Attending: Urology | Admitting: Urology

## 2016-11-03 ENCOUNTER — Encounter (HOSPITAL_BASED_OUTPATIENT_CLINIC_OR_DEPARTMENT_OTHER)
Admission: RE | Disposition: A | Discharge status: Home / Self Care | Payer: Self-pay | Source: Ambulatory Visit | Attending: Urology

## 2016-11-03 ENCOUNTER — Encounter (HOSPITAL_BASED_OUTPATIENT_CLINIC_OR_DEPARTMENT_OTHER): Payer: Self-pay | Admitting: Anesthesiology

## 2016-11-03 DIAGNOSIS — Z9079 Acquired absence of other genital organ(s): Secondary | ICD-10-CM | POA: Diagnosis not present

## 2016-11-03 DIAGNOSIS — Z87442 Personal history of urinary calculi: Secondary | ICD-10-CM | POA: Diagnosis not present

## 2016-11-03 DIAGNOSIS — N201 Calculus of ureter: Secondary | ICD-10-CM | POA: Diagnosis present

## 2016-11-03 DIAGNOSIS — N132 Hydronephrosis with renal and ureteral calculous obstruction: Secondary | ICD-10-CM | POA: Diagnosis not present

## 2016-11-03 DIAGNOSIS — Z79899 Other long term (current) drug therapy: Secondary | ICD-10-CM | POA: Diagnosis not present

## 2016-11-03 HISTORY — PX: CYSTOSCOPY WITH RETROGRADE PYELOGRAM, URETEROSCOPY AND STENT PLACEMENT: SHX5789

## 2016-11-03 HISTORY — PX: HOLMIUM LASER APPLICATION: SHX5852

## 2016-11-03 SURGERY — CYSTOURETEROSCOPY, WITH RETROGRADE PYELOGRAM AND STENT INSERTION
Anesthesia: General | Site: Renal | Laterality: Left

## 2016-11-03 MED ORDER — FENTANYL CITRATE (PF) 100 MCG/2ML IJ SOLN
INTRAMUSCULAR | Status: AC
  Filled 2016-11-03: qty 2

## 2016-11-03 MED ORDER — CEPHALEXIN 500 MG PO CAPS: 500.0000 mg | ORAL_CAPSULE | Freq: Two times a day (BID) | ORAL | 0 refills | Status: AC

## 2016-11-03 MED ORDER — OXYCODONE-ACETAMINOPHEN 5-325 MG PO TABS
1.0000 | ORAL_TABLET | ORAL | Status: DC | PRN
  Administered 2016-11-03: 1 via ORAL
  Filled 2016-11-03: qty 2

## 2016-11-03 MED ORDER — METOCLOPRAMIDE HCL 5 MG/ML IJ SOLN
10.0000 mg | Freq: Once | INTRAMUSCULAR | Status: DC | PRN
  Filled 2016-11-03: qty 2

## 2016-11-03 MED ORDER — KETOROLAC TROMETHAMINE 30 MG/ML IJ SOLN
INTRAMUSCULAR | Status: DC | PRN
  Administered 2016-11-03: 30 mg via INTRAVENOUS

## 2016-11-03 MED ORDER — DEXAMETHASONE SODIUM PHOSPHATE 4 MG/ML IJ SOLN
INTRAMUSCULAR | Status: DC | PRN
Start: 2016-11-03 — End: 2016-11-03
  Administered 2016-11-03: 10 mg via INTRAVENOUS

## 2016-11-03 MED ORDER — PROPOFOL 10 MG/ML IV BOLUS
INTRAVENOUS | Status: DC | PRN
  Administered 2016-11-03: 150 mg via INTRAVENOUS

## 2016-11-03 MED ORDER — SENNOSIDES-DOCUSATE SODIUM 8.6-50 MG PO TABS: 1.0000 | ORAL_TABLET | Freq: Two times a day (BID) | ORAL | 0 refills | Status: AC

## 2016-11-03 MED ORDER — KETOROLAC TROMETHAMINE 10 MG PO TABS: 10.0000 mg | ORAL_TABLET | Freq: Four times a day (QID) | ORAL | 1 refills | Status: AC | PRN

## 2016-11-03 MED ORDER — DEXAMETHASONE SODIUM PHOSPHATE 10 MG/ML IJ SOLN
INTRAMUSCULAR | Status: AC
  Filled 2016-11-03: qty 1

## 2016-11-03 MED ORDER — KETOROLAC TROMETHAMINE 30 MG/ML IJ SOLN
INTRAMUSCULAR | Status: AC
  Filled 2016-11-03: qty 1

## 2016-11-03 MED ORDER — FENTANYL CITRATE (PF) 100 MCG/2ML IJ SOLN
25.0000 ug | INTRAMUSCULAR | Status: DC | PRN
  Administered 2016-11-03: 25 ug via INTRAVENOUS
  Filled 2016-11-03: qty 1

## 2016-11-03 MED ORDER — PROPOFOL 10 MG/ML IV BOLUS
INTRAVENOUS | Status: AC
  Filled 2016-11-03: qty 20

## 2016-11-03 MED ORDER — ONDANSETRON HCL 4 MG/2ML IJ SOLN
INTRAMUSCULAR | Status: DC | PRN
  Administered 2016-11-03: 4 mg via INTRAVENOUS

## 2016-11-03 MED ORDER — LACTATED RINGERS IV SOLN
INTRAVENOUS | Status: DC
  Administered 2016-11-03 (×2): via INTRAVENOUS
  Filled 2016-11-03: qty 1000

## 2016-11-03 MED ORDER — OXYCODONE-ACETAMINOPHEN 5-325 MG PO TABS: 1.0000 | ORAL_TABLET | ORAL | 0 refills | Status: AC | PRN

## 2016-11-03 MED ORDER — GENTAMICIN SULFATE 40 MG/ML IJ SOLN
320.0000 mg | Freq: Once | INTRAMUSCULAR | Status: AC
  Administered 2016-11-03: 320 mg via INTRAVENOUS
  Filled 2016-11-03: qty 8

## 2016-11-03 MED ORDER — OXYCODONE-ACETAMINOPHEN 5-325 MG PO TABS
ORAL_TABLET | ORAL | Status: AC
  Filled 2016-11-03: qty 1

## 2016-11-03 MED ORDER — LACTATED RINGERS IV SOLN
INTRAVENOUS | Status: DC
  Filled 2016-11-03: qty 1000

## 2016-11-03 MED ORDER — MIDAZOLAM HCL 5 MG/5ML IJ SOLN
INTRAMUSCULAR | Status: DC | PRN
  Administered 2016-11-03: 2 mg via INTRAVENOUS

## 2016-11-03 MED ORDER — SODIUM CHLORIDE 0.9 % IR SOLN
Status: DC | PRN
  Administered 2016-11-03: 4000 mL

## 2016-11-03 MED ORDER — ONDANSETRON HCL 4 MG/2ML IJ SOLN
INTRAMUSCULAR | Status: AC
  Filled 2016-11-03: qty 2

## 2016-11-03 MED ORDER — LIDOCAINE 2% (20 MG/ML) 5 ML SYRINGE
INTRAMUSCULAR | Status: AC
  Filled 2016-11-03: qty 5

## 2016-11-03 MED ORDER — IOHEXOL 300 MG/ML  SOLN
INTRAMUSCULAR | Status: DC | PRN
  Administered 2016-11-03: 4 mL

## 2016-11-03 MED ORDER — GENTAMICIN IN SALINE 1.6-0.9 MG/ML-% IV SOLN
80.0000 mg | INTRAVENOUS | Status: DC
  Filled 2016-11-03: qty 50

## 2016-11-03 MED ORDER — LIDOCAINE 2% (20 MG/ML) 5 ML SYRINGE
INTRAMUSCULAR | Status: DC | PRN
  Administered 2016-11-03: 100 mg via INTRAVENOUS

## 2016-11-03 MED ORDER — MIDAZOLAM HCL 2 MG/2ML IJ SOLN
INTRAMUSCULAR | Status: AC
  Filled 2016-11-03: qty 2

## 2016-11-03 MED ORDER — MEPERIDINE HCL 25 MG/ML IJ SOLN
6.2500 mg | INTRAMUSCULAR | Status: DC | PRN
  Filled 2016-11-03: qty 1

## 2016-11-03 SURGICAL SUPPLY — 40 items
BAG DRAIN URO-CYSTO SKYTR STRL (DRAIN) ×2 IMPLANT
BASKET DAKOTA 1.9FR 11X120 (BASKET) IMPLANT
BASKET LASER NITINOL 1.9FR (BASKET) ×2 IMPLANT
BASKET STNLS GEMINI 4WIRE 3FR (BASKET) IMPLANT
BASKET ZERO TIP NITINOL 2.4FR (BASKET) IMPLANT
CATH INTERMIT  6FR 70CM (CATHETERS) ×2 IMPLANT
CATH URET 5FR 28IN CONE TIP (BALLOONS)
CATH URET 5FR 28IN OPEN ENDED (CATHETERS) IMPLANT
CATH URET 5FR 70CM CONE TIP (BALLOONS) IMPLANT
CLOTH BEACON ORANGE TIMEOUT ST (SAFETY) ×2 IMPLANT
ELECT REM PT RETURN 9FT ADLT (ELECTROSURGICAL)
ELECTRODE REM PT RTRN 9FT ADLT (ELECTROSURGICAL) IMPLANT
FIBER LASER FLEXIVA 365 (UROLOGICAL SUPPLIES) IMPLANT
FIBER LASER TRAC TIP (UROLOGICAL SUPPLIES) ×2 IMPLANT
GLOVE BIO SURGEON STRL SZ7.5 (GLOVE) ×2 IMPLANT
GLOVE BIOGEL PI IND STRL 7.5 (GLOVE) ×2 IMPLANT
GLOVE BIOGEL PI INDICATOR 7.5 (GLOVE) ×2
GOWN STRL REUS W/ TWL LRG LVL3 (GOWN DISPOSABLE) ×2 IMPLANT
GOWN STRL REUS W/ TWL XL LVL3 (GOWN DISPOSABLE) ×1 IMPLANT
GOWN STRL REUS W/TWL LRG LVL3 (GOWN DISPOSABLE) ×6 IMPLANT
GOWN STRL REUS W/TWL XL LVL3 (GOWN DISPOSABLE) ×1
GUIDEWIRE 0.038 PTFE COATED (WIRE) IMPLANT
GUIDEWIRE ANG ZIPWIRE 038X150 (WIRE) ×2 IMPLANT
GUIDEWIRE STR DUAL SENSOR (WIRE) ×2 IMPLANT
IV NS 1000ML (IV SOLUTION) ×1
IV NS 1000ML BAXH (IV SOLUTION) ×1 IMPLANT
IV NS IRRIG 3000ML ARTHROMATIC (IV SOLUTION) ×2 IMPLANT
KIT BALLIN UROMAX 15FX10 (LABEL) IMPLANT
KIT BALLN UROMAX 15FX4 (MISCELLANEOUS) IMPLANT
KIT BALLN UROMAX 26 75X4 (MISCELLANEOUS)
KIT ROOM TURNOVER WOR (KITS) ×2 IMPLANT
MANIFOLD NEPTUNE II (INSTRUMENTS) ×2 IMPLANT
NS IRRIG 500ML POUR BTL (IV SOLUTION) IMPLANT
PACK CYSTO (CUSTOM PROCEDURE TRAY) ×2 IMPLANT
SET HIGH PRES BAL DIL (LABEL)
STENT POLARIS 5FRX24 (STENTS) ×2 IMPLANT
SYRINGE 10CC LL (SYRINGE) ×2 IMPLANT
SYRINGE IRR TOOMEY STRL 70CC (SYRINGE) IMPLANT
TUBE CONNECTING 12X1/4 (SUCTIONS) IMPLANT
TUBE FEEDING 8FR 16IN STR KANG (MISCELLANEOUS) ×2 IMPLANT

## 2016-11-03 NOTE — Anesthesia Preprocedure Evaluation (Signed)
Anesthesia Evaluation  Patient identified by MRN, date of birth, ID band Patient awake    Reviewed: Allergy & Precautions, NPO status , Patient's Chart, lab work & pertinent test results  Airway Mallampati: II  TM Distance: >3 FB Neck ROM: Full    Dental no notable dental hx.    Pulmonary neg pulmonary ROS,    Pulmonary exam normal breath sounds clear to auscultation       Cardiovascular negative cardio ROS Normal cardiovascular exam Rhythm:Regular Rate:Normal     Neuro/Psych negative neurological ROS  negative psych ROS   GI/Hepatic negative GI ROS, Neg liver ROS,   Endo/Other  negative endocrine ROS  Renal/GU negative Renal ROS  negative genitourinary   Musculoskeletal negative musculoskeletal ROS (+)   Abdominal   Peds negative pediatric ROS (+)  Hematology negative hematology ROS (+)   Anesthesia Other Findings   Reproductive/Obstetrics negative OB ROS                             Anesthesia Physical Anesthesia Plan  ASA: II  Anesthesia Plan: General   Post-op Pain Management:    Induction: Intravenous  Airway Management Planned: LMA and Oral ETT  Additional Equipment:   Intra-op Plan:   Post-operative Plan: Extubation in OR  Informed Consent: I have reviewed the patients History and Physical, chart, labs and discussed the procedure including the risks, benefits and alternatives for the proposed anesthesia with the patient or authorized representative who has indicated his/her understanding and acceptance.   Dental advisory given  Plan Discussed with: CRNA  Anesthesia Plan Comments:         Anesthesia Quick Evaluation

## 2016-11-03 NOTE — Transfer of Care (Signed)
Immediate Anesthesia Transfer of Care Note  Patient: [name]  Procedure(s) Performed: Procedure(s): CYSTOSCOPY WITH RETROGRADE PYELOGRAM, URETEROSCOPY AND STENT PLACEMENT, STONE EXTRACTION WITH BASKET (Left) HOLMIUM LASER APPLICATION (Left)  Patient Location: PACU  Anesthesia Type:General  Level of Consciousness: sedated and responds to stimulation  Airway & Oxygen Therapy: Patient Spontanous Breathing and Patient connected to nasal cannula oxygen  Post-op Assessment: Report given to RN  Post vital signs: Reviewed and stable  Last Vitals: 107/71, 70, 8, 100% Vitals:   11/03/16 1220  BP: (!) 105/52  Pulse: 76  Resp: 16  Temp: 36.6 C    Last Pain:  Vitals:   11/03/16 1240  TempSrc:   PainSc: 5       Patients Stated Pain Goal: 5 ([phone] [id number])  Complications: No apparent anesthesia complications

## 2016-11-03 NOTE — Discharge Instructions (Signed)
[facility] [phone] Post Ureteroscopy With or Without Stent Instructions  Definitions:  Ureter: The duct that transports urine from the kidney to the bladder. Stent:   A plastic hollow tube that is placed into the ureter, from the kidney to the  bladder to prevent the ureter from swelling shut.  GENERAL INSTRUCTIONS:  Despite the fact that no skin incisions were used, the area around the ureter and bladder is raw and irritated. The stent is a foreign body which will further irritate the bladder wall. This irritation is manifested by increased frequency of urination, both day and night, and by an increase in the urge to urinate. In some, the urge to urinate is present almost always. Sometimes the urge is strong enough that you may not be able to stop yourself from urinating. The only real cure is to remove the stent and then give time for the bladder wall to heal which can't be done until the danger of the ureter swelling shut has passed, which varies.  You may see some blood in your urine while the stent is in place and a few days afterwards. Do not be alarmed, even if the urine was clear for a while. Get off your feet and drink lots of fluids until clearing occurs. If you start to pass clots or don't improve, call us.  DIET: You may return to your normal diet immediately. Because of the raw surface of your bladder, alcohol, spicy foods, acid type foods and drinks with caffeine may cause irritation or frequency and should be used in moderation. To keep your urine flowing freely and to avoid constipation, drink plenty of fluids during the day ( 8-10 glasses ). Tip: Avoid cranberry juice because it is very acidic.  ACTIVITY: Your physical activity doesn't need to be restricted. However, if you are very active, you may see some blood in your urine. We suggest that you reduce your activity under these circumstances until the bleeding has stopped.  BOWELS: It is  important to keep your bowels regular during the postoperative period. Straining with bowel movements can cause bleeding. A bowel movement every other day is reasonable. Use a mild laxative if needed, such as Milk of Magnesia 2-3 tablespoons, or 2 Dulcolax tablets. Call if you continue to have problems. If you have been taking narcotics for pain, before, during or after your surgery, you may be constipated. Take a laxative if necessary.   MEDICATION: You should resume your pre-surgery medications unless told not to. In addition you will often be given an antibiotic to prevent infection. These should be taken as prescribed until the bottles are finished unless you are having an unusual reaction to one of the drugs.  PROBLEMS YOU SHOULD REPORT TO [place]:  Fevers over 100.5 Fahrenheit.  Heavy bleeding, or clots ( See above notes about blood in urine ).  Inability to urinate.  Drug reactions ( hives, rash, nausea, vomiting, diarrhea ).  Severe burning or pain with urination that is not improving.  FOLLOW-UP: You will need a follow-up appointment to monitor your progress. Call for this appointment at the number listed above. Usually the first appointment will be about three to fourteen days after your surgery.    Post Anesthesia Home Care Instructions  Activity: Get plenty of rest for the remainder of the day. A responsible adult should stay with you for 24 hours following the procedure.  For the next 24 hours, DO NOT: -Drive a car -Paediatric nurse -Drink alcoholic  beverages -Take any medication unless instructed by your physician -Make any legal decisions or sign important papers.  Meals: Start with liquid foods such as gelatin or soup. Progress to regular foods as tolerated. Avoid greasy, spicy, heavy foods. If nausea and/or vomiting occur, drink only clear liquids until the nausea and/or vomiting subsides. Call your physician if vomiting continues.  Special  Instructions/Symptoms: Your throat may feel dry or sore from the anesthesia or the breathing tube placed in your throat during surgery. If this causes discomfort, gargle with warm salt water. The discomfort should disappear within 24 hours.  If you had a scopolamine patch placed behind your ear for the management of post- operative nausea and/or vomiting:  1. The medication in the patch is effective for 72 hours, after which it should be removed.  Wrap patch in a tissue and discard in the trash. Wash hands thoroughly with soap and water. 2. You may remove the patch earlier than 72 hours if you experience unpleasant side effects which may include dry mouth, dizziness or visual disturbances. 3. Avoid touching the patch. Wash your hands with soap and water after contact with the patch.   1 - You may have urinary urgency (bladder spasms) and bloody urine on / off with stent in place. This is normal.  2 - Remove tethered stent on Monday morning at home by pulling on string, then blue-white plastic tubing, and discarding. Office is open Monday if any issues arise.   3 - Call MD or go to ER for fever >102, severe pain / nausea / vomiting not relieved by medications, or acute change in medical status

## 2016-11-03 NOTE — Brief Op Note (Signed)
11/03/2016  2:20 PM  PATIENT:  [name]  46 y.o. female  PRE-OPERATIVE DIAGNOSIS:  LEFT URETERAL STONE  POST-OPERATIVE DIAGNOSIS:  LEFT URETERAL STONE  PROCEDURE:  Procedure(s): CYSTOSCOPY WITH RETROGRADE PYELOGRAM, URETEROSCOPY AND STENT PLACEMENT, STONE EXTRACTION WITH BASKET (Left) HOLMIUM LASER APPLICATION (Left)  SURGEON:  Surgeon(s) and Role:    * Alexis Frock, MD - Primary  PHYSICIAN ASSISTANT:   ASSISTANTS: none   ANESTHESIA:   general  EBL:  Total I/O In: 100 [I.V.:100] Out: -   BLOOD ADMINISTERED:none  DRAINS: none   LOCAL MEDICATIONS USED:  NONE  SPECIMEN:  Source of Specimen:  left ureteral stone fragments  DISPOSITION OF SPECIMEN:  [facility] for compositional analysis  COUNTS:  YES  TOURNIQUET:  * No tourniquets in log *  DICTATION: .Other Dictation: Dictation Number [id number]  PLAN OF CARE: Discharge to home after PACU  PATIENT DISPOSITION:  PACU - hemodynamically stable.   Delay start of Pharmacological VTE agent (>24hrs) due to surgical blood loss or risk of bleeding: yes

## 2016-11-03 NOTE — Anesthesia Postprocedure Evaluation (Signed)
Anesthesia Post Note  Patient: [name]  Procedure(s) Performed: Procedure(s) (LRB): CYSTOSCOPY WITH RETROGRADE PYELOGRAM, URETEROSCOPY AND STENT PLACEMENT, STONE EXTRACTION WITH BASKET (Left) HOLMIUM LASER APPLICATION (Left)  Patient location during evaluation: PACU Anesthesia Type: General Level of consciousness: awake and alert Pain management: pain level controlled Vital Signs Assessment: post-procedure vital signs reviewed and stable Respiratory status: spontaneous breathing, nonlabored ventilation, respiratory function stable and patient connected to nasal cannula oxygen Cardiovascular status: blood pressure returned to baseline and stable Postop Assessment: no signs of nausea or vomiting Anesthetic complications: no       Last Vitals:  Vitals:   11/03/16 1220 11/03/16 1433  BP: (!) 105/52 107/71  Pulse: 76   Resp: 16 (!) 8  Temp: 36.6 C 36.6 C    Last Pain:  Vitals:   11/03/16 1500  TempSrc:   PainSc: [place]

## 2016-11-03 NOTE — Anesthesia Procedure Notes (Signed)
Procedure Name: LMA Insertion Date/Time: 11/03/2016 1:55 PM Performed by: Bethena Roys T Pre-anesthesia Checklist: Patient identified, Emergency Drugs available, Suction available and Patient being monitored Patient Re-evaluated:Patient Re-evaluated prior to inductionOxygen Delivery Method: Circle system utilized Preoxygenation: Pre-oxygenation with 100% oxygen Intubation Type: IV induction Ventilation: Mask ventilation without difficulty LMA: LMA inserted LMA Size: 4.0 Number of attempts: 1 Airway Equipment and Method: Bite block Placement Confirmation: positive ETCO2 Tube secured with: Tape Dental Injury: Teeth and Oropharynx as per pre-operative assessment

## 2016-11-03 NOTE — H&P (Signed)
[name] is an 46 y.o. female.    Chief Complaint: Pre-op LEFT ureteroscopic stone manipulaiton  HPI:   1 - Nephrolithiasis - 53mm left distal ureteral stone with mod hydro by ER CT 10/08/16 on eval flank pain. Stone is 65mm, 550HU, SSD 9cm and NOT easily seen on scout images. Bilateral punctate (too small to measure) intra-renal stones only additional. Cr 0.6. UA/UCX negative. Presently on medical management and pain controlled, straining urine.   PMH sig for cesarean x2, fibroid surgery. NO CV disease / blood thinnes. 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 Her PCP is Sharilyn Sites MD.   Today " [name] " is seen to proceed with LEFT ureteroscopic stone manipulation. No interval fevers.    Past Medical History:  Diagnosis Date  . ADD (attention deficit disorder)   . Frequency of urination   . Hematuria   . History of ectopic pregnancy    left side [id number]  right side 2002/  left side 2003  . Left ureteral stone   . Nephrolithiasis    bilateral non-obstructive per ct 10-08-2016  . Urgency of urination   . Uterine fibroid    per ct 10-18-2016    Past Surgical History:  Procedure Laterality Date  . CESAREAN SECTION  2001 and 2003  . ECTOPIC PREGNANCY SURGERY  2002   Right Salpingectomy  . MYOMECTOMY ABDOMINAL APPROACH  2002    History reviewed. No pertinent family history. Social History:  reports that she has never smoked. She has never used smokeless tobacco. She reports that she does not drink alcohol or use drugs.  Allergies: No Known Allergies  No prescriptions prior to admission.    No results found for this or any previous visit (from the past 48 hour(s)). No results found.  Review of Systems  Constitutional: Negative.  Negative for chills and fever.  HENT: Negative.   Eyes: Negative.   Respiratory: Negative.   Cardiovascular: Negative.   Gastrointestinal: Negative.   Genitourinary: Positive for flank pain.  Skin: Negative.   Neurological: Negative.    Endo/Heme/Allergies: Negative.   Psychiatric/Behavioral: Negative.     There were no vitals taken for this visit. Physical Exam  Constitutional: She appears well-developed.  HENT:  Head: Normocephalic.  Eyes: Pupils are equal, round, and reactive to light.  Neck: Normal range of motion.  Cardiovascular: Normal rate.   Respiratory: Effort normal.  GI: Soft.  Genitourinary:  Genitourinary Comments: Mild left CVAT  Musculoskeletal: Normal range of motion.  Neurological: She is alert.  Skin: Skin is warm.  Psychiatric: She has a normal mood and affect. Her behavior is normal. Thought content normal.     Assessment/Plan  Proceed as planned with LEFT ureteroscopic stone manipulaiton. Risks, benefits, alternatives, expected peri-op course, need for possible temporary stents, need for possible staged approach discussed previously and reiterated today.   Alexis Frock, MD 11/03/2016, 10:01 AM

## 2016-11-04 ENCOUNTER — Encounter (HOSPITAL_BASED_OUTPATIENT_CLINIC_OR_DEPARTMENT_OTHER): Payer: Self-pay | Admitting: Urology

## 2016-11-04 NOTE — Op Note (Deleted)
  The note originally documented on this encounter has been moved the the encounter in which it belongs.  

## 2016-11-04 NOTE — Op Note (Signed)
NAMEEMMA, [name]            ACCOUNT NO.:  [id number]  MEDICAL RECORD NO.:  [id number]  LOCATION:                                FACILITY:  [facility]  PHYSICIAN:  Alexis Frock, MD     DATE OF BIRTH:  November 13, 1970  DATE OF PROCEDURE:  11/03/2016                              OPERATIVE REPORT   DIAGNOSIS:  Left ureteral stone with refractory colic.  PROCEDURES PERFORMED: 1. Cystoscopy with left retrograde pyelogram interpretation. 2. Left ureteroscopy with laser lithotripsy. 3. Insertion of left ureteral stent, 5 x 24 Polaris with tether.  ESTIMATED BLOOD LOSS:  Nil.  COMPLICATIONS:  None.  SPECIMENS:  Left distal ureteral stone fragments for compositional analysis.  FINDINGS: 1. Unremarkable urinary bladder. 2. Mild hydroureteronephrosis to mobile filling defect in the distal     ureter consistent with known stone. 3. Complete resolution of all stone fragments larger than 1/3rd mm in     the left ureter following laser lithotripsy and basket extraction. 4. Successful placement of left ureteral stent, proximal in renal     pelvis and distal in urinary bladder.  INDICATION:  Ms. [name] is a very pleasant 46 year old lady who on workup of colicky flank pain was found to have a left distal ureteral stone, approximately 5 mm.  She had a several week course medical management with alpha-blockers and pain medications; however, she failed to pass her stone and her colic was persistent.  Options were discussed for further management including continued medical therapy versus shockwave lithotripsy versus ureteroscopy, and she wished to proceed with the latter.  Informed consent was obtained and placed in the medical record.  PROCEDURE IN DETAIL:  The patient being [name] verified. Procedure being left ureteroscopic stone manipulation was confirmed. Procedure was carried out.  Time-out was performed.  Intravenous antibiotics were administered.  General LMA anesthesia  introduced.  The patient placed into a low lithotomy position.  Sterile field was created by prepping and draping the patient's vagina, introitus, and proximal thighs using iodine.  Next, cystourethroscopy was performed using a rigid cystoscope with offset lens.  Inspection of the urinary bladder revealed no diverticula, calcifications, or papillary lesions.  Ureteral orifices were singleton bilaterally.  The left ureteral orifice was cannulated with a 6-French end-hole catheter and left retrograde pyelogram was obtained.  Left retrograde pyelogram demonstrated a single left ureter with single- system left kidney.  There was mild hydroureteronephrosis to a filling defect in distal ureter consistent with known stone.  A 0.038 Zip wire was advanced to the level of the upper pole, set aside as a safety wire. An 8-French feeding tube placed in the urinary bladder for pressure release.  Semi-rigid ureteroscopy was performed of the distal left ureter alongside a separate Sensor working wire.  The stone in question was indeed found just above the intramural ureter.  It did appear too large for simple basketing.  As such, holmium laser energy applied to the stone using settings of 0.2 joules and 20 Hz fragmenting the stone into 2 pieces that were quite oblong.  These were then grasped sequentially on their long axis and removed and set aside for compositional analysis.  Following these maneuvers, there was complete  resolution of all stone fragments larger than 1/3rd mm within the entire length of left ureter.  There was some mucosal edema at the area of prior stone impaction and it was felt that brief interval stenting would be warranted.  As such, a new 5 x 24 Polaris-type stent was placed with remaining safety wire using cystoscopic and fluoroscopic guidance.  Good proximal and distal deployment were noted.  Tether was left in place and fashioned to the thigh and the procedure was terminated.   The patient tolerated the procedure well.  No immediate periprocedural complications.  The patient was taken to the postanesthesia care unit in a stable condition.          ______________________________ Alexis Frock, MD     TM/MEDQ  D:  11/03/2016  T:  11/04/2016  Job:  [id number]

## 2017-04-18 ENCOUNTER — Other Ambulatory Visit: Payer: Self-pay | Admitting: Family Medicine

## 2017-04-18 DIAGNOSIS — Z1231 Encounter for screening mammogram for malignant neoplasm of breast: Secondary | ICD-10-CM

## 2017-05-04 ENCOUNTER — Ambulatory Visit
Admission: RE | Admit: 2017-05-04 | Discharge: 2017-05-04 | Disposition: A | Discharge status: Home / Self Care | Payer: BC Managed Care – PPO | Source: Ambulatory Visit | Attending: Family Medicine | Admitting: Family Medicine

## 2017-05-04 ENCOUNTER — Encounter (INDEPENDENT_AMBULATORY_CARE_PROVIDER_SITE_OTHER): Payer: Self-pay

## 2017-05-04 DIAGNOSIS — Z1231 Encounter for screening mammogram for malignant neoplasm of breast: Secondary | ICD-10-CM

## 2018-12-31 IMAGING — CT CT RENAL STONE PROTOCOL
2 of 4 series · 16 of 46 positions shown, 18 images · non-contrast
Comparison: None.

CLINICAL DATA: Sudden onset of left flank pain 30 minutes prior to
arrival.

EXAM:
CT ABDOMEN AND PELVIS WITHOUT CONTRAST
TECHNIQUE: Multidetector CT imaging of the abdomen and pelvis was performed
following the standard protocol without IV contrast.

[Series 2: axial st · axial · 0.69mm/px · z∈[-955,-530]mm · 13 of 93 slices shown, 15 images]
[im 4/93  soft-tissue]
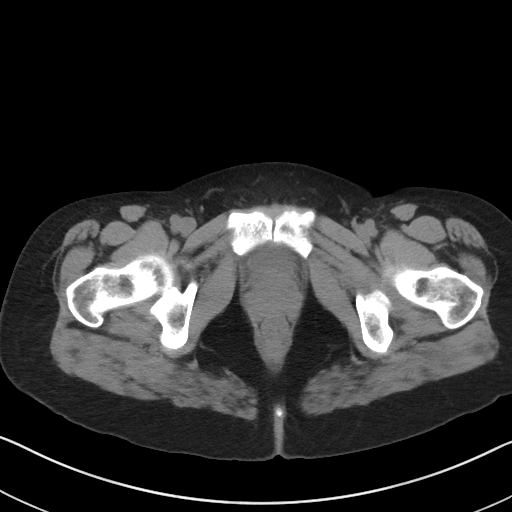
[im 4/93  bone]
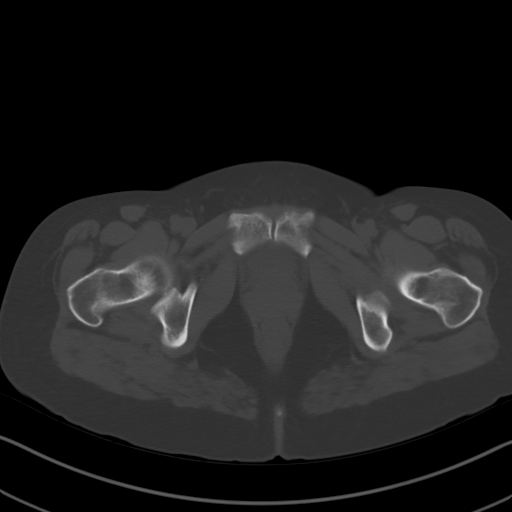
[im 12/93  soft-tissue]
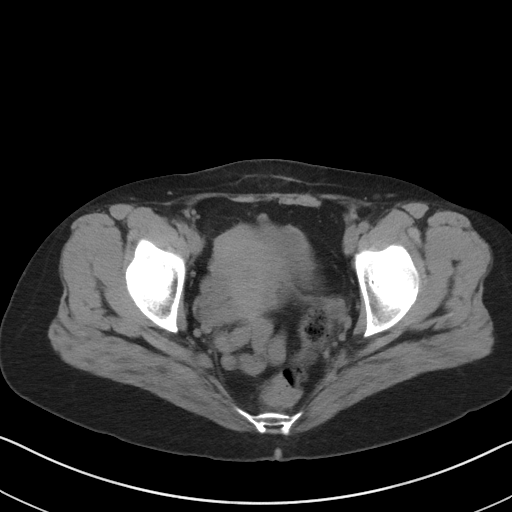
[im 20/93  soft-tissue]
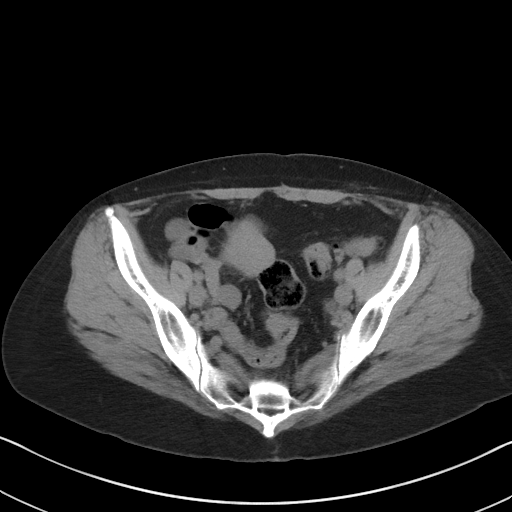
[im 27/93  soft-tissue]
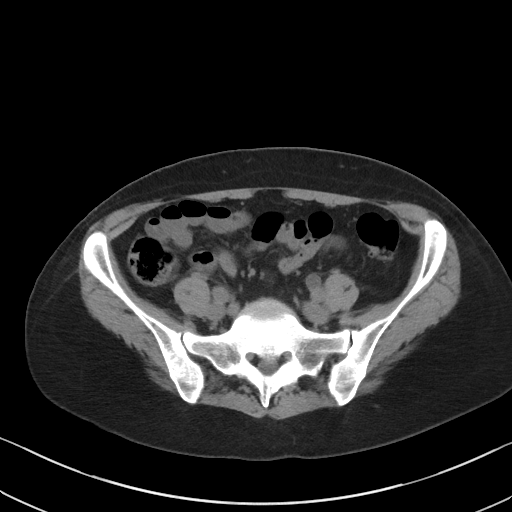
[im 31/93  soft-tissue]
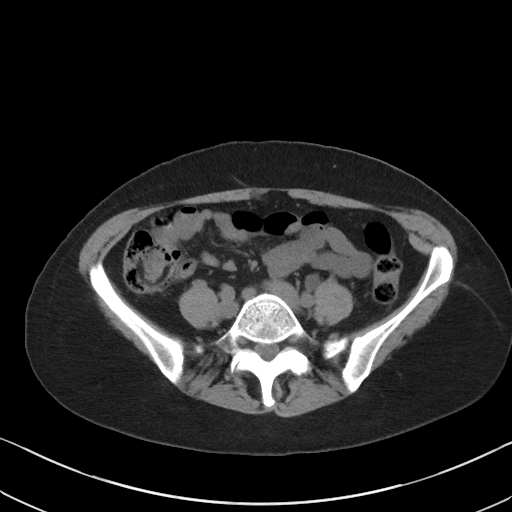
[im 39/93  soft-tissue]
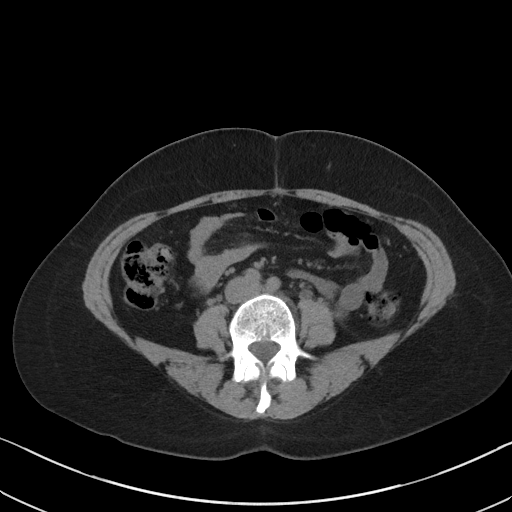
[im 47/93  soft-tissue]
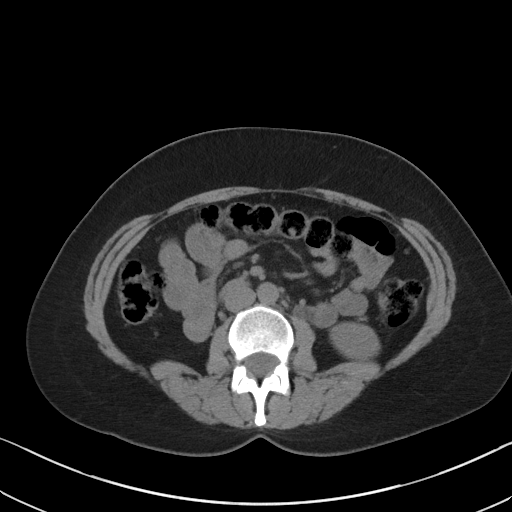
[im 54/93  soft-tissue]
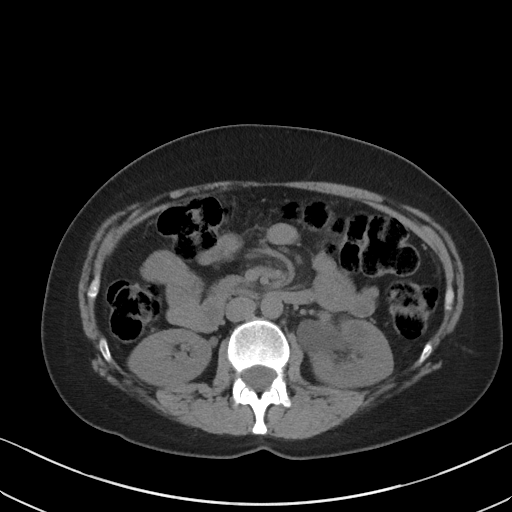
[im 62/93  soft-tissue]
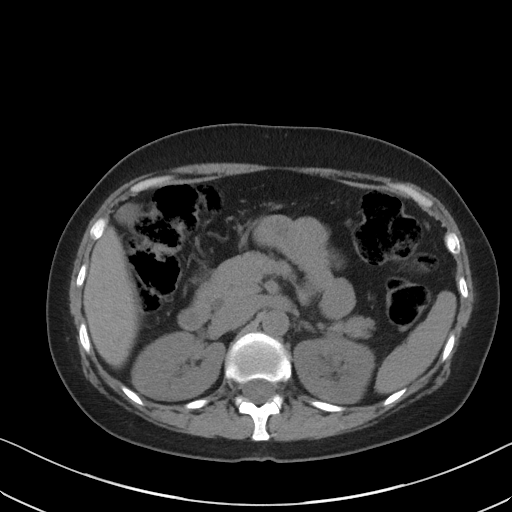
[im 62/93  bone]
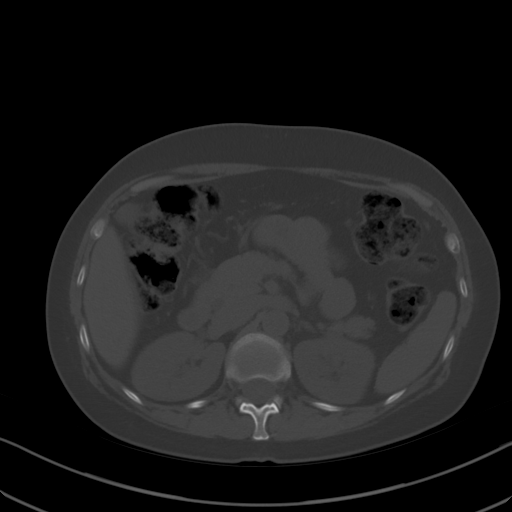
[im 66/93  soft-tissue]
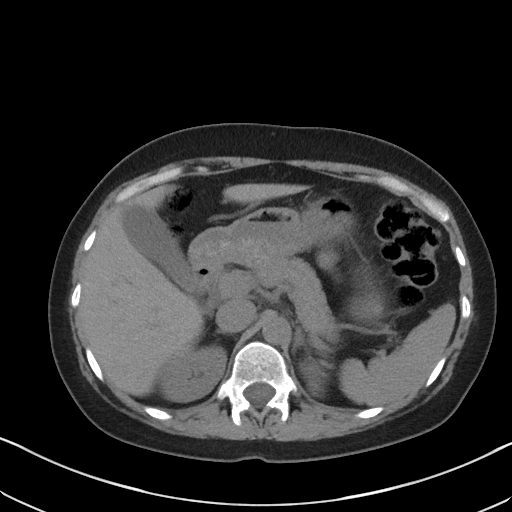
[im 73/93  soft-tissue]
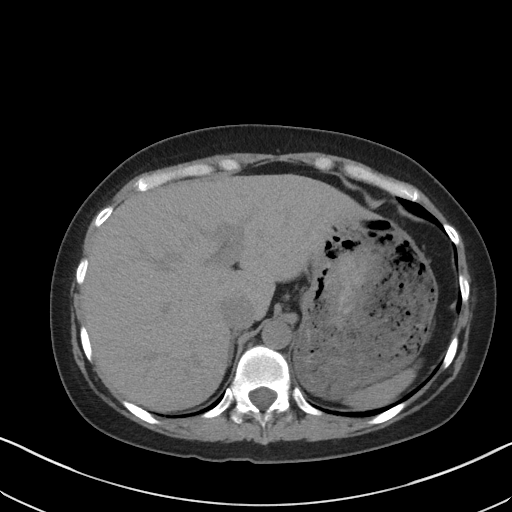
[im 81/93  soft-tissue]
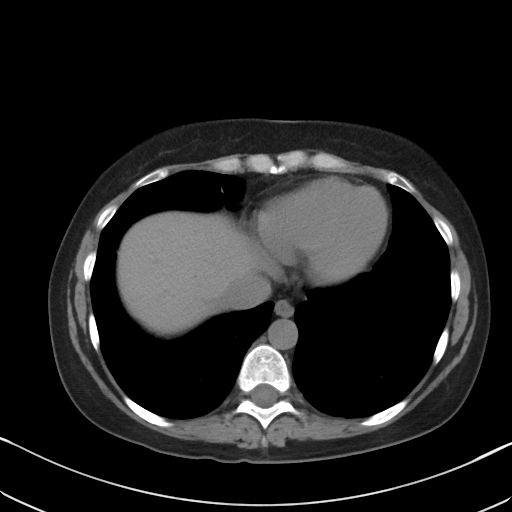
[im 89/93  soft-tissue]
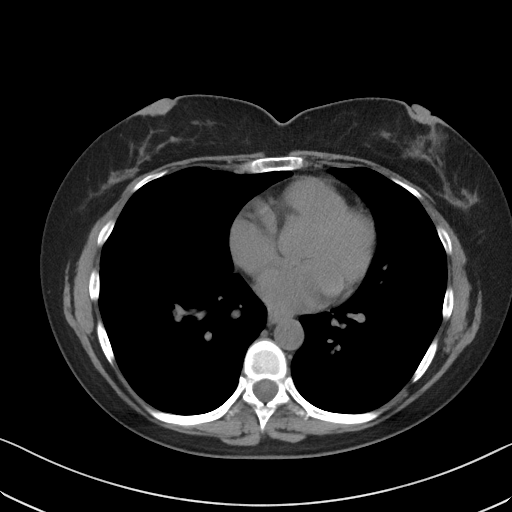

[Series 3: coronal st · coronal · 0.70mm/px · 3 of 99 slices shown]
[im 33/99  soft-tissue]
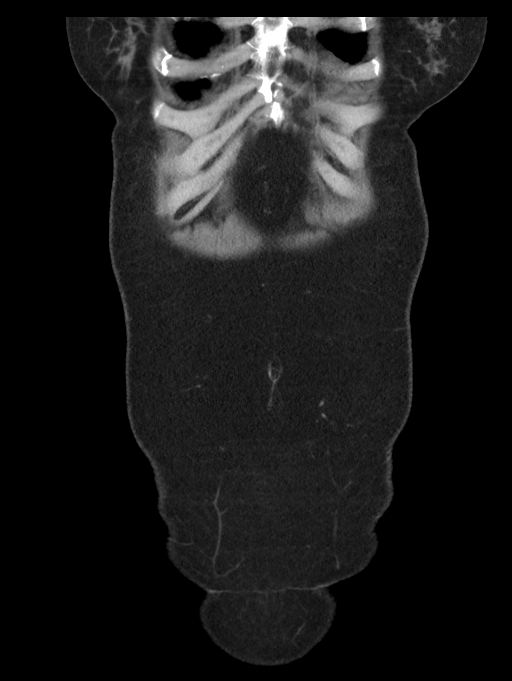
[im 44/99  soft-tissue]
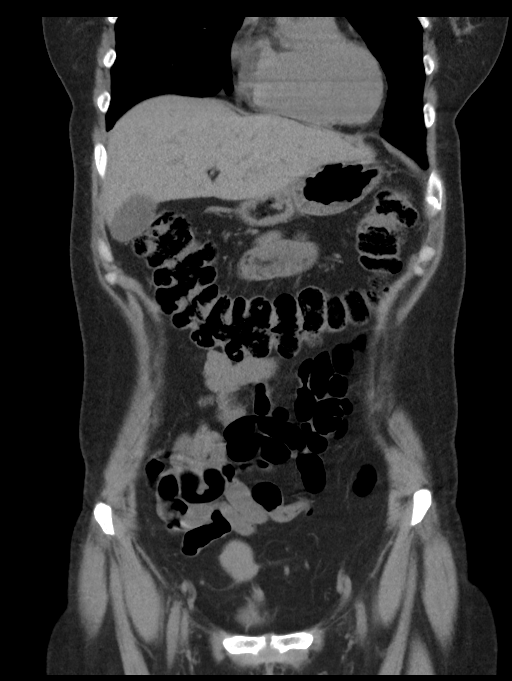
[im 55/99  soft-tissue]
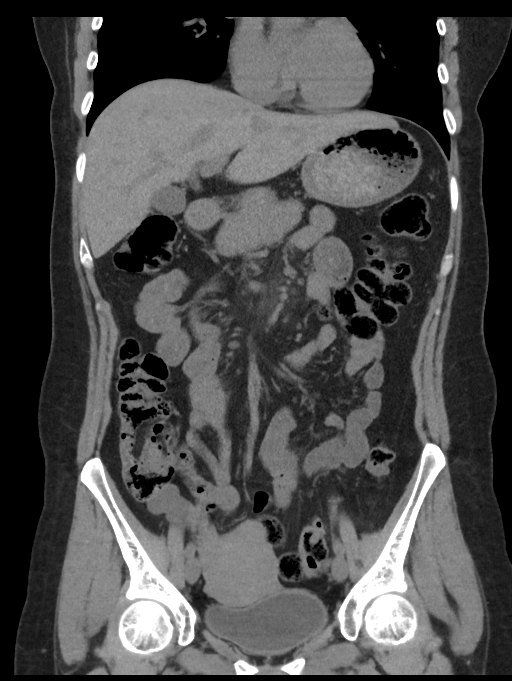

[16 of 46 positions shown; findings below may reference images not displayed]

FINDINGS: Lower chest: No acute abnormality.

Hepatobiliary: No focal liver abnormality is seen. No gallstones,
gallbladder wall thickening, or biliary dilatation.

Pancreas: Unremarkable. No pancreatic ductal dilatation or
surrounding inflammatory changes.

Spleen: Normal in size without focal abnormality.

Adrenals/Urinary Tract: Both adrenals are normal.

Marked hydronephrosis and ureteral dilatation on the left due to an
obstructing 5 mm calculus 1 cm from the ureterovesical junction. 2
mm nonobstructing calculi in both upper pole renal collecting
systems.

Urinary bladder is unremarkable.

Stomach/Bowel: Stomach, small bowel and colon are unremarkable.

Vascular/Lymphatic: The abdominal aorta is normal in caliber without
atherosclerotic calcification. No adenopathy or ascites.

Reproductive: Contour irregularities of the uterus likely represent
multiple fibroids measuring up to 2.5 cm. No adnexal masses.

Other: Incidental small fat containing umbilical hernia.

Musculoskeletal: No significant skeletal lesion.
IMPRESSION: 1. Obstructing 5 mm calculus of the distal left ureter, 1 cm from
the UVJ. Marked hydronephrosis.
2. Bilateral upper pole nephrolithiasis
3. Multiple uterine fibroids.
